# Patient Record
Sex: Female | Born: 1980 | Race: White | Hispanic: No | Marital: Married | Smoking: Never smoker
Health system: Southern US, Community
[De-identification: ages and names within clinical notes are randomized; demographics above are authoritative.]

## PROBLEM LIST (undated history)

## (undated) DIAGNOSIS — J189 Pneumonia, unspecified organism: Secondary | ICD-10-CM

## (undated) DIAGNOSIS — N200 Calculus of kidney: Secondary | ICD-10-CM

## (undated) DIAGNOSIS — Z86718 Personal history of other venous thrombosis and embolism: Secondary | ICD-10-CM

## (undated) DIAGNOSIS — F411 Generalized anxiety disorder: Secondary | ICD-10-CM

## (undated) DIAGNOSIS — Z654 Victim of crime and terrorism: Secondary | ICD-10-CM

## (undated) DIAGNOSIS — Z87442 Personal history of urinary calculi: Secondary | ICD-10-CM

## (undated) DIAGNOSIS — Z9889 Other specified postprocedural states: Secondary | ICD-10-CM

## (undated) DIAGNOSIS — R112 Nausea with vomiting, unspecified: Secondary | ICD-10-CM

## (undated) HISTORY — PX: DILATION AND CURETTAGE OF UTERUS: SHX78

## (undated) HISTORY — PX: CHOLECYSTECTOMY: SHX55

## (undated) HISTORY — PX: LITHOTRIPSY: SUR834

## (undated) HISTORY — DX: Calculus of kidney: N20.0

## (undated) HISTORY — DX: Victim of crime and terrorism: Z65.4

## (undated) HISTORY — DX: Personal history of other venous thrombosis and embolism: Z86.718

## (undated) HISTORY — DX: Generalized anxiety disorder: F41.1

---

## 2018-09-16 DIAGNOSIS — N946 Dysmenorrhea, unspecified: Secondary | ICD-10-CM | POA: Diagnosis not present

## 2018-09-16 DIAGNOSIS — Z124 Encounter for screening for malignant neoplasm of cervix: Secondary | ICD-10-CM | POA: Diagnosis not present

## 2018-11-09 ENCOUNTER — Ambulatory Visit (INDEPENDENT_AMBULATORY_CARE_PROVIDER_SITE_OTHER): Payer: BLUE CROSS/BLUE SHIELD | Admitting: Obstetrics & Gynecology

## 2018-11-09 ENCOUNTER — Encounter: Payer: Self-pay | Admitting: Obstetrics & Gynecology

## 2018-11-09 ENCOUNTER — Other Ambulatory Visit: Payer: Self-pay

## 2018-11-09 DIAGNOSIS — E559 Vitamin D deficiency, unspecified: Secondary | ICD-10-CM | POA: Insufficient documentation

## 2018-11-09 DIAGNOSIS — N920 Excessive and frequent menstruation with regular cycle: Secondary | ICD-10-CM | POA: Insufficient documentation

## 2018-11-09 DIAGNOSIS — E538 Deficiency of other specified B group vitamins: Secondary | ICD-10-CM | POA: Diagnosis not present

## 2018-11-09 NOTE — Addendum Note (Signed)
Addended by: Kathie Dike on: 11/09/2018 09:58 AM   Modules accepted: Orders

## 2018-11-09 NOTE — Progress Notes (Signed)
I connected with  Vickie Morgan on 11/09/18 by a video enabled telemedicine application and verified that I am speaking with the correct person using two identifiers.   I discussed the limitations of evaluation and management by telemedicine. The patient expressed understanding and agreed to proceed.

## 2018-11-09 NOTE — Progress Notes (Signed)
   TELEHEALTH VIRTUAL GYNECOLOGY VISIT ENCOUNTER NOTE  I connected with Vickie Morgan on 11/09/18 at  8:30 AM EDT by WebEx at home and verified that I am speaking with the correct person using two identifiers.   I discussed the limitations, risks, security and privacy concerns of performing an evaluation and management service by telephone and the availability of in person appointments. I also discussed with the patient that there may be a patient responsible charge related to this service. The patient expressed understanding and agreed to proceed. She had an u/s which showed a 14 mm endometrium and she was given provera but she had hives after taking 2 pills.    History:  Vickie Morgan is a 38 y.o. married G1P0010 (2 cats and 1 dog)  female being evaluated today for heavy periods. These last for 3 days, then stop for a day and then bleed heavily for another 3 days. This has been going on for about 3 years. No blood work has been done. She denies any abnormal vaginal discharge, bleeding, pelvic pain or other concerns.  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    She had a d&c in her 56s with an abnormal uterine pregnancy.   Past Medical History:  Diagnosis Date  . Kidney stones    The following portions of the patient's history were reviewed and updated as appropriate: allergies, current medications, past family history, past medical history, past social history, past surgical history and problem list.   Health Maintenance:  Inadequate pap   Review of Systems:  Pertinent items noted in HPI and remainder of comprehensive ROS otherwise negative. Married for 3 years, uses natural family planning, wants a future pregnancy. She is homemaker. She has lost 30# with diet.  Physical Exam:   General:  Alert, oriented and cooperative.   Mental Status: Normal mood and affect perceived. Normal judgment and thought content.  Physical exam deferred due to nature of the encounter   Labs and Imaging No results found for this or any previous visit (from the past 336 hour(s)). No results found.    Assessment and Plan:     Menorrhagia- check CBC, TSH I offered EMBX versus hysteroscopy and d&c She wants the d&c, hysteroscopy. I emailed [place] to schedule this, told patient that surgery is backlogged.   Check Vits B12 and D since she has a deficiency.   I discussed the assessment and treatment plan with the patient. The patient was provided an opportunity to ask questions and all were answered. The patient agreed with the plan and demonstrated an understanding of the instructions.   The patient was advised to call back or seek an in-person evaluation/go to the ED if the symptoms worsen or if the condition fails to improve as anticipated.  I provided 10 minutes of non-face-to-face time during this encounter.   Allie Bossier, MD [facility] [employer], [facility]

## 2018-11-10 ENCOUNTER — Other Ambulatory Visit: Payer: Self-pay | Admitting: Obstetrics & Gynecology

## 2018-11-10 LAB — CBC
HCT: 39.4 % (ref 35.0–45.0)
Hemoglobin: 13.3 g/dL (ref 11.7–15.5)
MCH: 28.4 pg (ref 27.0–33.0)
MCHC: 33.8 g/dL (ref 32.0–36.0)
MCV: 84.2 fL (ref 80.0–100.0)
MPV: 12.7 fL — ABNORMAL HIGH (ref 7.5–12.5)
Platelets: 212 10*3/uL (ref 140–400)
RBC: 4.68 10*6/uL (ref 3.80–5.10)
RDW: 13.6 % (ref 11.0–15.0)
WBC: 7.7 10*3/uL (ref 3.8–10.8)

## 2018-11-10 LAB — TSH: TSH: 2.09 mIU/L

## 2018-11-10 LAB — VITAMIN D 25 HYDROXY (VIT D DEFICIENCY, FRACTURES): Vit D, 25-Hydroxy: 15 ng/mL — ABNORMAL LOW (ref 30–100)

## 2018-11-10 LAB — VITAMIN B12: Vitamin B-12: 264 pg/mL (ref 200–1100)

## 2018-11-10 MED ORDER — VITAMIN D (ERGOCALCIFEROL) 1.25 MG (50000 UNIT) PO CAPS: 50000.0000 [IU] | ORAL_CAPSULE | ORAL | 0 refills | Status: DC

## 2018-11-10 NOTE — Progress Notes (Unsigned)
Vitamin D prescribed for deficiency. 

## 2019-01-04 ENCOUNTER — Ambulatory Visit (INDEPENDENT_AMBULATORY_CARE_PROVIDER_SITE_OTHER): Payer: BC Managed Care – PPO | Admitting: Obstetrics & Gynecology

## 2019-01-04 ENCOUNTER — Other Ambulatory Visit: Payer: Self-pay

## 2019-01-04 ENCOUNTER — Encounter: Payer: Self-pay | Admitting: Obstetrics & Gynecology

## 2019-01-04 VITALS — BP 150/88 | HR 98 | Resp 16 | Ht 67.5 in | Wt 243.0 lb

## 2019-01-04 DIAGNOSIS — Z01419 Encounter for gynecological examination (general) (routine) without abnormal findings: Secondary | ICD-10-CM | POA: Diagnosis not present

## 2019-01-04 DIAGNOSIS — Z3202 Encounter for pregnancy test, result negative: Secondary | ICD-10-CM | POA: Diagnosis not present

## 2019-01-04 DIAGNOSIS — Z0000 Encounter for general adult medical examination without abnormal findings: Secondary | ICD-10-CM

## 2019-01-04 DIAGNOSIS — Z113 Encounter for screening for infections with a predominantly sexual mode of transmission: Secondary | ICD-10-CM | POA: Diagnosis not present

## 2019-01-04 DIAGNOSIS — E6609 Other obesity due to excess calories: Secondary | ICD-10-CM

## 2019-01-04 DIAGNOSIS — Z124 Encounter for screening for malignant neoplasm of cervix: Secondary | ICD-10-CM | POA: Diagnosis not present

## 2019-01-04 DIAGNOSIS — N938 Other specified abnormal uterine and vaginal bleeding: Secondary | ICD-10-CM | POA: Insufficient documentation

## 2019-01-04 DIAGNOSIS — E669 Obesity, unspecified: Secondary | ICD-10-CM | POA: Insufficient documentation

## 2019-01-04 DIAGNOSIS — Z1151 Encounter for screening for human papillomavirus (HPV): Secondary | ICD-10-CM

## 2019-01-04 DIAGNOSIS — E559 Vitamin D deficiency, unspecified: Secondary | ICD-10-CM

## 2019-01-04 LAB — POCT URINE PREGNANCY: Preg Test, Ur: NEGATIVE
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
## 2019-01-05 LAB — HIV ANTIBODY (ROUTINE TESTING W REFLEX): HIV 1&2 Ab, 4th Generation: NONREACTIVE

## 2019-01-05 LAB — HEPATITIS B SURFACE ANTIGEN: Hepatitis B Surface Ag: NONREACTIVE

## 2019-01-05 LAB — CYTOLOGY - PAP
Chlamydia: NEGATIVE
Diagnosis: NEGATIVE
HPV: NOT DETECTED
Neisseria Gonorrhea: NEGATIVE

## 2019-01-05 LAB — HEPATITIS C ANTIBODY
Hepatitis C Ab: NONREACTIVE
SIGNAL TO CUT-OFF: 0.01 (ref <1.00)

## 2019-01-05 LAB — RPR: RPR Ser Ql: NONREACTIVE

## 2019-01-05 LAB — HSV 2 ANTIBODY, IGG: HSV 2 Glycoprotein G Ab, IgG: <0.9 index

## 2019-01-11 ENCOUNTER — Emergency Department (HOSPITAL_COMMUNITY): Payer: BC Managed Care – PPO

## 2019-01-11 ENCOUNTER — Encounter (HOSPITAL_COMMUNITY): Payer: Self-pay | Admitting: Emergency Medicine

## 2019-01-11 ENCOUNTER — Other Ambulatory Visit: Payer: Self-pay

## 2019-01-11 ENCOUNTER — Emergency Department (INDEPENDENT_AMBULATORY_CARE_PROVIDER_SITE_OTHER)
Admission: EM | Admit: 2019-01-11 | Discharge: 2019-01-11 | Disposition: A | Discharge status: Home / Self Care | Payer: BC Managed Care – PPO | Source: Home / Self Care

## 2019-01-11 ENCOUNTER — Emergency Department (HOSPITAL_COMMUNITY)
Admission: EM | Admit: 2019-01-11 | Discharge: 2019-01-11 | Disposition: A | Discharge status: Home / Self Care | Payer: BC Managed Care – PPO | Attending: Emergency Medicine | Admitting: Emergency Medicine

## 2019-01-11 DIAGNOSIS — R1031 Right lower quadrant pain: Secondary | ICD-10-CM

## 2019-01-11 DIAGNOSIS — Z9049 Acquired absence of other specified parts of digestive tract: Secondary | ICD-10-CM | POA: Insufficient documentation

## 2019-01-11 DIAGNOSIS — Z79899 Other long term (current) drug therapy: Secondary | ICD-10-CM | POA: Insufficient documentation

## 2019-01-11 DIAGNOSIS — R1011 Right upper quadrant pain: Secondary | ICD-10-CM | POA: Diagnosis not present

## 2019-01-11 DIAGNOSIS — R1032 Left lower quadrant pain: Secondary | ICD-10-CM | POA: Diagnosis not present

## 2019-01-11 DIAGNOSIS — R109 Unspecified abdominal pain: Secondary | ICD-10-CM

## 2019-01-11 LAB — CBC WITH DIFFERENTIAL/PLATELET
Abs Immature Granulocytes: 0.03 10*3/uL (ref 0.00–0.07)
Basophils Absolute: 0.1 10*3/uL (ref 0.0–0.1)
Basophils Relative: 1 %
Eosinophils Absolute: 0.3 10*3/uL (ref 0.0–0.5)
Eosinophils Relative: 3 %
HCT: 41 % (ref 36.0–46.0)
Hemoglobin: 13 g/dL (ref 12.0–15.0)
Immature Granulocytes: 0 %
Lymphocytes Relative: 20 %
Lymphs Abs: 2 10*3/uL (ref 0.7–4.0)
MCH: 28 pg (ref 26.0–34.0)
MCHC: 31.7 g/dL (ref 30.0–36.0)
MCV: 88.2 fL (ref 80.0–100.0)
Monocytes Absolute: 0.7 10*3/uL (ref 0.1–1.0)
Monocytes Relative: 7 %
Neutro Abs: 6.8 10*3/uL (ref 1.7–7.7)
Neutrophils Relative %: 69 %
Platelets: 230 10*3/uL (ref 150–400)
RBC: 4.65 MIL/uL (ref 3.87–5.11)
RDW: 13.8 % (ref 11.5–15.5)
WBC: 9.8 10*3/uL (ref 4.0–10.5)
nRBC: 0 % (ref 0.0–0.2)

## 2019-01-11 LAB — POCT URINALYSIS DIP (MANUAL ENTRY)
Bilirubin, UA: NEGATIVE
Blood, UA: NEGATIVE
Glucose, UA: NEGATIVE mg/dL
Ketones, POC UA: NEGATIVE mg/dL
Leukocytes, UA: NEGATIVE
Nitrite, UA: NEGATIVE
Protein Ur, POC: NEGATIVE mg/dL
Spec Grav, UA: 1.015 (ref 1.010–1.025)
Urobilinogen, UA: 0.2 E.U./dL
pH, UA: 8.5 — AB (ref 5.0–8.0)

## 2019-01-11 LAB — COMPREHENSIVE METABOLIC PANEL
ALT: 19 U/L (ref 0–44)
AST: 14 U/L — ABNORMAL LOW (ref 15–41)
Albumin: 4 g/dL (ref 3.5–5.0)
Alkaline Phosphatase: 57 U/L (ref 38–126)
Anion gap: 9 (ref 5–15)
BUN: 11 mg/dL (ref 6–20)
CO2: 23 mmol/L (ref 22–32)
Calcium: 8.6 mg/dL — ABNORMAL LOW (ref 8.9–10.3)
Chloride: 107 mmol/L (ref 98–111)
Creatinine, Ser: 0.69 mg/dL (ref 0.44–1.00)
GFR calc Af Amer: >60 mL/min (ref >60)
GFR calc non Af Amer: >60 mL/min (ref >60)
Glucose, Bld: 115 mg/dL — ABNORMAL HIGH (ref 70–99)
Potassium: 3.3 mmol/L — ABNORMAL LOW (ref 3.5–5.1)
Sodium: 139 mmol/L (ref 135–145)
Total Bilirubin: 0.3 mg/dL (ref 0.3–1.2)
Total Protein: 6.8 g/dL (ref 6.5–8.1)

## 2019-01-11 LAB — I-STAT BETA HCG BLOOD, ED (MC, WL, AP ONLY): I-stat hCG, quantitative: <5 m[IU]/mL (ref <5)

## 2019-01-11 LAB — LIPASE, BLOOD: Lipase: 28 U/L (ref 11–51)

## 2019-01-11 MED ORDER — SODIUM CHLORIDE 0.9 % IV BOLUS
1000.0000 mL | Freq: Once | INTRAVENOUS | Status: AC
  Administered 2019-01-11: 1000 mL via INTRAVENOUS

## 2019-01-11 MED ORDER — ONDANSETRON HCL 4 MG PO TABS: 4.0000 mg | ORAL_TABLET | Freq: Three times a day (TID) | ORAL | 0 refills | Status: DC | PRN

## 2019-01-11 MED ORDER — SODIUM CHLORIDE (PF) 0.9 % IJ SOLN
INTRAMUSCULAR | Status: AC
  Filled 2019-01-11: qty 50

## 2019-01-11 MED ORDER — IOHEXOL 300 MG/ML  SOLN
100.0000 mL | Freq: Once | INTRAMUSCULAR | Status: AC | PRN
  Administered 2019-01-11: 100 mL via INTRAVENOUS

## 2019-01-11 MED ORDER — ONDANSETRON HCL 4 MG/2ML IJ SOLN
4.0000 mg | Freq: Once | INTRAMUSCULAR | Status: AC
  Administered 2019-01-11: 20:00:00 4 mg via INTRAVENOUS
  Filled 2019-01-11: qty 2

## 2019-01-11 MED ORDER — FENTANYL CITRATE (PF) 100 MCG/2ML IJ SOLN
50.0000 ug | Freq: Once | INTRAMUSCULAR | Status: AC
  Administered 2019-01-11: 50 ug via INTRAVENOUS
  Filled 2019-01-11: qty 2
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
## 2019-01-11 NOTE — ED Notes (Signed)
Pt reports that she has rt sided intermittent abdominal pain 6/10 that radiates to entire stomach with associated nausea. Pt states that pain started 3 days ago, and has some diarrhea

## 2019-01-11 NOTE — Discharge Instructions (Signed)
°  Your exam is concerning for appendicitis, a medical emergency. You have declined EMS transport, but it is still highly advised that you have your husband drive you to the emergency department for further evaluation and treatment of your abdominal pain.  Please do not eat or drink anything along the way in case surgery is indicated later this evening.

## 2019-01-11 NOTE — ED Triage Notes (Signed)
Right lower abdominal pain x 3 days.  Now has become generalized, and radiates to her back.  Has had some nausea, and a hx of kidney stones.

## 2019-01-11 NOTE — ED Triage Notes (Signed)
Having intermittent lower abd pains on right side for 3 days. Reports that sent by UC for CT scan of abd to rule out appendicitis. Diarrhea is normal due to IBS. Pains are different than kidney stones.
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
## 2019-01-18 ENCOUNTER — Ambulatory Visit (INDEPENDENT_AMBULATORY_CARE_PROVIDER_SITE_OTHER): Payer: BC Managed Care – PPO | Admitting: Osteopathic Medicine

## 2019-01-18 ENCOUNTER — Encounter: Payer: Self-pay | Admitting: Osteopathic Medicine

## 2019-01-18 VITALS — BP 136/80 | HR 90 | Temp 98.4°F | Ht 67.5 in | Wt 242.4 lb

## 2019-01-18 DIAGNOSIS — R5382 Chronic fatigue, unspecified: Secondary | ICD-10-CM | POA: Diagnosis not present

## 2019-01-18 DIAGNOSIS — G43709 Chronic migraine without aura, not intractable, without status migrainosus: Secondary | ICD-10-CM

## 2019-01-18 DIAGNOSIS — K580 Irritable bowel syndrome with diarrhea: Secondary | ICD-10-CM | POA: Diagnosis not present

## 2019-01-18 DIAGNOSIS — Z87442 Personal history of urinary calculi: Secondary | ICD-10-CM | POA: Insufficient documentation

## 2019-01-18 DIAGNOSIS — Z654 Victim of crime and terrorism: Secondary | ICD-10-CM | POA: Insufficient documentation

## 2019-01-18 DIAGNOSIS — M791 Myalgia, unspecified site: Secondary | ICD-10-CM

## 2019-01-18 DIAGNOSIS — R6889 Other general symptoms and signs: Secondary | ICD-10-CM

## 2019-01-18 DIAGNOSIS — Z9049 Acquired absence of other specified parts of digestive tract: Secondary | ICD-10-CM | POA: Insufficient documentation

## 2019-01-18 DIAGNOSIS — Z8619 Personal history of other infectious and parasitic diseases: Secondary | ICD-10-CM

## 2019-01-18 DIAGNOSIS — Z8659 Personal history of other mental and behavioral disorders: Secondary | ICD-10-CM | POA: Insufficient documentation

## 2019-01-18 DIAGNOSIS — R635 Abnormal weight gain: Secondary | ICD-10-CM

## 2019-01-18 MED ORDER — LORAZEPAM 0.5 MG PO TABS: 0.5000 mg | ORAL_TABLET | Freq: Three times a day (TID) | ORAL | 0 refills | Status: DC | PRN

## 2019-01-18 NOTE — Patient Instructions (Signed)
Migraine Prevention: 4-6 weeks for any effect, 2-3 months for full effect   Butterbur   Feverfew  Magnesium supplementation 400-800 mg/day Mag Citrate (not Mag Oxide)
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
## 2019-01-19 ENCOUNTER — Encounter (HOSPITAL_BASED_OUTPATIENT_CLINIC_OR_DEPARTMENT_OTHER): Payer: Self-pay | Admitting: *Deleted

## 2019-01-19 ENCOUNTER — Other Ambulatory Visit: Payer: Self-pay

## 2019-01-19 LAB — RHEUMATOID FACTOR: Rheumatoid fact SerPl-aCnc: <14 IU/mL (ref <14)

## 2019-01-19 LAB — COMPLETE METABOLIC PANEL WITH GFR
AG Ratio: 1.8 (calc) (ref 1.0–2.5)
ALT: 17 U/L (ref 6–29)
AST: 13 U/L (ref 10–30)
Albumin: 4.3 g/dL (ref 3.6–5.1)
Alkaline phosphatase (APISO): 59 U/L (ref 31–125)
BUN: 8 mg/dL (ref 7–25)
CO2: 26 mmol/L (ref 20–32)
Calcium: 9.3 mg/dL (ref 8.6–10.2)
Chloride: 106 mmol/L (ref 98–110)
Creat: 0.66 mg/dL (ref 0.50–1.10)
GFR, Est African American: 130 mL/min/{1.73_m2} (ref >=60)
GFR, Est Non African American: 112 mL/min/{1.73_m2} (ref >=60)
Globulin: 2.4 g/dL (calc) (ref 1.9–3.7)
Glucose, Bld: 111 mg/dL (ref 65–139)
Potassium: 3.9 mmol/L (ref 3.5–5.3)
Sodium: 142 mmol/L (ref 135–146)
Total Bilirubin: 0.4 mg/dL (ref 0.2–1.2)
Total Protein: 6.7 g/dL (ref 6.1–8.1)

## 2019-01-19 LAB — SEDIMENTATION RATE: Sed Rate: 4 mm/h (ref 0–20)

## 2019-01-19 LAB — ANA: Anti Nuclear Antibody (ANA): NEGATIVE

## 2019-01-19 LAB — CK: Total CK: 44 U/L (ref 29–143)

## 2019-01-19 LAB — LIPID PANEL
Cholesterol: 231 mg/dL — ABNORMAL HIGH (ref <200)
HDL: 39 mg/dL — ABNORMAL LOW (ref >=50)
LDL Cholesterol (Calc): 153 mg/dL (calc) — ABNORMAL HIGH
Non-HDL Cholesterol (Calc): 192 mg/dL (calc) — ABNORMAL HIGH (ref <130)
Total CHOL/HDL Ratio: 5.9 (calc) — ABNORMAL HIGH (ref <5.0)
Triglycerides: 233 mg/dL — ABNORMAL HIGH (ref <150)

## 2019-01-19 LAB — HIGH SENSITIVITY CRP: hs-CRP: 4.3 mg/L — ABNORMAL HIGH

## 2019-01-20 ENCOUNTER — Telehealth: Payer: Self-pay

## 2019-01-20 MED ORDER — LORAZEPAM 0.5 MG PO TABS: 0.5000 mg | ORAL_TABLET | Freq: Three times a day (TID) | ORAL | 0 refills | Status: DC | PRN

## 2019-01-20 NOTE — Telephone Encounter (Signed)
Sent!

## 2019-01-20 NOTE — Telephone Encounter (Signed)
Pt called stating that lorazepam rx went to the wrong pharmacy. She is requesting for rx to be sent to [facility] located in [place]. I have called [facility] in [place] and cancelled previous rx. Spoke to [employer]. Pls resend rx to [facility] in [place]. Thanks.

## 2019-01-20 NOTE — Telephone Encounter (Signed)
Pt advised.

## 2019-01-25 ENCOUNTER — Encounter (HOSPITAL_BASED_OUTPATIENT_CLINIC_OR_DEPARTMENT_OTHER)
Admission: RE | Admit: 2019-01-25 | Discharge: 2019-01-25 | Disposition: A | Discharge status: Home / Self Care | Payer: BC Managed Care – PPO | Source: Ambulatory Visit | Attending: Obstetrics & Gynecology | Admitting: Obstetrics & Gynecology

## 2019-01-25 ENCOUNTER — Other Ambulatory Visit (HOSPITAL_COMMUNITY)
Admission: RE | Admit: 2019-01-25 | Discharge: 2019-01-25 | Disposition: A | Discharge status: Home / Self Care | Payer: BC Managed Care – PPO | Source: Ambulatory Visit | Attending: Obstetrics & Gynecology | Admitting: Obstetrics & Gynecology

## 2019-01-25 ENCOUNTER — Other Ambulatory Visit: Payer: Self-pay

## 2019-01-25 DIAGNOSIS — N920 Excessive and frequent menstruation with regular cycle: Secondary | ICD-10-CM | POA: Diagnosis not present

## 2019-01-25 DIAGNOSIS — Z01812 Encounter for preprocedural laboratory examination: Secondary | ICD-10-CM | POA: Diagnosis not present

## 2019-01-25 DIAGNOSIS — Z1159 Encounter for screening for other viral diseases: Secondary | ICD-10-CM | POA: Insufficient documentation

## 2019-01-25 LAB — POCT PREGNANCY, URINE: Preg Test, Ur: NEGATIVE

## 2019-01-25 LAB — SARS CORONAVIRUS 2 (TAT 6-24 HRS): SARS Coronavirus 2: NEGATIVE

## 2019-01-25 NOTE — Progress Notes (Signed)
Ensure pre surgery drink given with instructions to complete by 0430 dos, pt verbalized understanding. 

## 2019-01-27 ENCOUNTER — Encounter (HOSPITAL_BASED_OUTPATIENT_CLINIC_OR_DEPARTMENT_OTHER)
Admission: RE | Disposition: A | Discharge status: Home / Self Care | Payer: Self-pay | Source: Home / Self Care | Attending: Obstetrics & Gynecology

## 2019-01-27 ENCOUNTER — Ambulatory Visit (HOSPITAL_BASED_OUTPATIENT_CLINIC_OR_DEPARTMENT_OTHER)
Admission: RE | Admit: 2019-01-27 | Discharge: 2019-01-27 | Disposition: A | Discharge status: Home / Self Care | Payer: BC Managed Care – PPO | Attending: Obstetrics & Gynecology | Admitting: Obstetrics & Gynecology

## 2019-01-27 ENCOUNTER — Ambulatory Visit (HOSPITAL_BASED_OUTPATIENT_CLINIC_OR_DEPARTMENT_OTHER): Payer: BC Managed Care – PPO | Admitting: Certified Registered Nurse Anesthetist

## 2019-01-27 ENCOUNTER — Encounter (HOSPITAL_BASED_OUTPATIENT_CLINIC_OR_DEPARTMENT_OTHER): Payer: Self-pay | Admitting: *Deleted

## 2019-01-27 DIAGNOSIS — Z881 Allergy status to other antibiotic agents status: Secondary | ICD-10-CM | POA: Diagnosis not present

## 2019-01-27 DIAGNOSIS — Z888 Allergy status to other drugs, medicaments and biological substances status: Secondary | ICD-10-CM | POA: Insufficient documentation

## 2019-01-27 DIAGNOSIS — Z91018 Allergy to other foods: Secondary | ICD-10-CM | POA: Diagnosis not present

## 2019-01-27 DIAGNOSIS — F411 Generalized anxiety disorder: Secondary | ICD-10-CM | POA: Insufficient documentation

## 2019-01-27 DIAGNOSIS — Z87892 Personal history of anaphylaxis: Secondary | ICD-10-CM | POA: Diagnosis not present

## 2019-01-27 DIAGNOSIS — Z79899 Other long term (current) drug therapy: Secondary | ICD-10-CM | POA: Diagnosis not present

## 2019-01-27 DIAGNOSIS — F4310 Post-traumatic stress disorder, unspecified: Secondary | ICD-10-CM | POA: Diagnosis not present

## 2019-01-27 DIAGNOSIS — Z885 Allergy status to narcotic agent status: Secondary | ICD-10-CM | POA: Diagnosis not present

## 2019-01-27 DIAGNOSIS — N920 Excessive and frequent menstruation with regular cycle: Secondary | ICD-10-CM

## 2019-01-27 DIAGNOSIS — N938 Other specified abnormal uterine and vaginal bleeding: Secondary | ICD-10-CM | POA: Diagnosis not present

## 2019-01-27 HISTORY — DX: Nausea with vomiting, unspecified: Z98.890

## 2019-01-27 HISTORY — DX: Other specified postprocedural states: R11.2

## 2019-01-27 HISTORY — DX: Pneumonia, unspecified organism: J18.9

## 2019-01-27 HISTORY — PX: HYSTEROSCOPY WITH D & C: SHX1775

## 2019-01-27 HISTORY — DX: Personal history of urinary calculi: Z87.442

## 2019-01-27 SURGERY — DILATATION AND CURETTAGE /HYSTEROSCOPY
Anesthesia: General | Site: Vagina

## 2019-01-27 MED ORDER — SCOPOLAMINE 1 MG/3DAYS TD PT72
1.0000 | MEDICATED_PATCH | Freq: Once | TRANSDERMAL | Status: DC
  Administered 2019-01-27: 1.5 mg via TRANSDERMAL

## 2019-01-27 MED ORDER — LIDOCAINE 2% (20 MG/ML) 5 ML SYRINGE
INTRAMUSCULAR | Status: AC
  Filled 2019-01-27: qty 5

## 2019-01-27 MED ORDER — FENTANYL CITRATE (PF) 100 MCG/2ML IJ SOLN
50.0000 ug | INTRAMUSCULAR | Status: DC | PRN
  Administered 2019-01-27 (×2): 50 ug via INTRAVENOUS

## 2019-01-27 MED ORDER — HYDROMORPHONE HCL 1 MG/ML IJ SOLN: 0.2500 mg | INTRAMUSCULAR | Status: DC | PRN

## 2019-01-27 MED ORDER — LIDOCAINE HCL (CARDIAC) PF 100 MG/5ML IV SOSY
PREFILLED_SYRINGE | INTRAVENOUS | Status: DC | PRN
  Administered 2019-01-27: 60 mg via INTRAVENOUS

## 2019-01-27 MED ORDER — PROPOFOL 10 MG/ML IV BOLUS
INTRAVENOUS | Status: AC
  Filled 2019-01-27: qty 20

## 2019-01-27 MED ORDER — SILVER NITRATE-POT NITRATE 75-25 % EX MISC
CUTANEOUS | Status: AC
  Filled 2019-01-27: qty 1

## 2019-01-27 MED ORDER — IBUPROFEN 600 MG PO TABS: 600.0000 mg | ORAL_TABLET | Freq: Four times a day (QID) | ORAL | 1 refills | Status: DC | PRN

## 2019-01-27 MED ORDER — KETOROLAC TROMETHAMINE 30 MG/ML IJ SOLN
INTRAMUSCULAR | Status: AC
  Filled 2019-01-27: qty 1

## 2019-01-27 MED ORDER — KETOROLAC TROMETHAMINE 30 MG/ML IJ SOLN
INTRAMUSCULAR | Status: DC | PRN
  Administered 2019-01-27: 30 mg via INTRAVENOUS

## 2019-01-27 MED ORDER — LACTATED RINGERS IV SOLN
INTRAVENOUS | Status: DC
  Administered 2019-01-27: 09:00:00 via INTRAVENOUS

## 2019-01-27 MED ORDER — SODIUM CHLORIDE 0.9 % IR SOLN
Status: DC | PRN
  Administered 2019-01-27: 3000 mL

## 2019-01-27 MED ORDER — DEXAMETHASONE SODIUM PHOSPHATE 4 MG/ML IJ SOLN
INTRAMUSCULAR | Status: DC | PRN
  Administered 2019-01-27: 5 mg via INTRAVENOUS

## 2019-01-27 MED ORDER — BUPIVACAINE HCL (PF) 0.5 % IJ SOLN
INTRAMUSCULAR | Status: AC
  Filled 2019-01-27: qty 30

## 2019-01-27 MED ORDER — ONDANSETRON HCL 4 MG/2ML IJ SOLN
INTRAMUSCULAR | Status: AC
  Filled 2019-01-27: qty 2

## 2019-01-27 MED ORDER — MIDAZOLAM HCL 2 MG/2ML IJ SOLN
INTRAMUSCULAR | Status: AC
  Filled 2019-01-27: qty 2

## 2019-01-27 MED ORDER — DEXMEDETOMIDINE HCL 200 MCG/2ML IV SOLN
INTRAVENOUS | Status: DC | PRN
  Administered 2019-01-27: 12 ug via INTRAVENOUS

## 2019-01-27 MED ORDER — SCOPOLAMINE 1 MG/3DAYS TD PT72
MEDICATED_PATCH | TRANSDERMAL | Status: AC
  Filled 2019-01-27: qty 1

## 2019-01-27 MED ORDER — MIDAZOLAM HCL 2 MG/2ML IJ SOLN
1.0000 mg | INTRAMUSCULAR | Status: DC | PRN
  Administered 2019-01-27: 10:00:00 2 mg via INTRAVENOUS

## 2019-01-27 MED ORDER — NAPROXEN 500 MG PO TABS: 500.0000 mg | ORAL_TABLET | Freq: Two times a day (BID) | ORAL | 1 refills | Status: DC

## 2019-01-27 MED ORDER — ONDANSETRON HCL 4 MG/2ML IJ SOLN
INTRAMUSCULAR | Status: DC | PRN
  Administered 2019-01-27: 4 mg via INTRAVENOUS

## 2019-01-27 MED ORDER — PROPOFOL 10 MG/ML IV BOLUS
INTRAVENOUS | Status: DC | PRN
  Administered 2019-01-27: 200 mg via INTRAVENOUS

## 2019-01-27 MED ORDER — FENTANYL CITRATE (PF) 100 MCG/2ML IJ SOLN
INTRAMUSCULAR | Status: AC
  Filled 2019-01-27: qty 2

## 2019-01-27 MED ORDER — LACTATED RINGERS IV SOLN: INTRAVENOUS | Status: DC

## 2019-01-27 MED ORDER — BUPIVACAINE HCL (PF) 0.5 % IJ SOLN
INTRAMUSCULAR | Status: DC | PRN
  Administered 2019-01-27: 30 mL

## 2019-01-27 SURGICAL SUPPLY — 24 items
BIPOLAR CUTTING LOOP 21FR (ELECTRODE)
BRIEF STRETCH FOR OB PAD XXL (UNDERPADS AND DIAPERS) ×3 IMPLANT
CANISTER SUCT 3000ML PPV (MISCELLANEOUS) ×1 IMPLANT
DILATOR CANAL MILEX (MISCELLANEOUS) IMPLANT
ELECT REM PT RETURN 9FT ADLT (ELECTROSURGICAL)
ELECTRODE REM PT RTRN 9FT ADLT (ELECTROSURGICAL) IMPLANT
GAUZE 4X4 16PLY RFD (DISPOSABLE) ×2 IMPLANT
GLOVE BIO SURGEON STRL SZ 6.5 (GLOVE) ×2 IMPLANT
GLOVE BIO SURGEONS STRL SZ 6.5 (GLOVE) ×1
GLOVE BIOGEL PI IND STRL 7.0 (GLOVE) ×1 IMPLANT
GLOVE BIOGEL PI INDICATOR 7.0 (GLOVE) ×2
GOWN STRL REUS W/ TWL XL LVL3 (GOWN DISPOSABLE) ×1 IMPLANT
GOWN STRL REUS W/TWL LRG LVL3 (GOWN DISPOSABLE) ×3 IMPLANT
GOWN STRL REUS W/TWL XL LVL3 (GOWN DISPOSABLE) ×2
KIT PROCEDURE FLUENT (KITS) ×3 IMPLANT
LOOP CUTTING BIPOLAR 21FR (ELECTRODE) IMPLANT
NDL SPNL 18GX3.5 QUINCKE PK (NEEDLE) IMPLANT
NEEDLE SPNL 18GX3.5 QUINCKE PK (NEEDLE) ×3 IMPLANT
PACK VAGINAL MINOR WOMEN LF (CUSTOM PROCEDURE TRAY) ×3 IMPLANT
PAD OB MATERNITY 4.3X12.25 (PERSONAL CARE ITEMS) ×3 IMPLANT
PAD PREP 24X48 CUFFED NSTRL (MISCELLANEOUS) ×3 IMPLANT
SEAL ROD LENS SCOPE MYOSURE (ABLATOR) ×2 IMPLANT
SLEEVE SCD COMPRESS KNEE MED (MISCELLANEOUS) ×3 IMPLANT
TOWEL GREEN STERILE FF (TOWEL DISPOSABLE) ×6 IMPLANT

## 2019-01-27 NOTE — Op Note (Signed)
01/27/2019  10:36 AM  PATIENT:  Vickie Morgan  38 y.o. female  PRE-OPERATIVE DIAGNOSIS:  Menorrhagia/DUB  POST-OPERATIVE DIAGNOSIS:  same  PROCEDURE:  DILATION AND CURETTAGE, DIAGNOSTIC HYSTEROSCOPY   SURGEON:  Surgeon(s) and Role:    * Virat Prather, Wilhemina Cash, MD - Primary  ANESTHESIA:   local and general  EBL:  5 mL   BLOOD ADMINISTERED:none  DRAINS: none   LOCAL MEDICATIONS USED:  MARCAINE     SPECIMEN:  Source of Specimen:  endometrial curettings  DISPOSITION OF SPECIMEN:  PATHOLOGY  COUNTS:  YES  TOURNIQUET:  * No tourniquets in log *  DICTATION: .Dragon Dictation  PLAN OF CARE: Discharge to home after PACU  PATIENT DISPOSITION:  PACU - hemodynamically stable.   Delay start of Pharmacological VTE agent (>24hrs) due to surgical blood loss or risk of bleeding: not applicable   The risks, benefits, and alternatives of surgery were explained, understood, and accepted. All questions were answered. Consents were signed. In the operating room general anesthesia was applied without complication, and she was placed in the dorsal lithotomy position. Her vagina was prepped and draped in the usual sterile fashion. A bimanual exam revealed a normal size and shape, anteverted, mobile, non-tender, normal adnexal exam.  A speculum was placed and a single-tooth tenaculum was used to grasp the anterior lip of her nulliparous cervix. A total of 30 mL of 0.5% Marcaine was used to perform a paracervical block. Her uterus sounded to 8 cm. Her cervix was carefully and slowly dilated to accommodate a diagnostic hysteroscope. Hysteroscopy revealed a normal endometrium. Both ostia were seen. The fundus was atrophic and only a small bit of somewhat shaggy endometrium was seen lower in the uterus. A curettage was done in all quadrants and the fundus of the uterus. A small amount of tissue was obtained. The tenaculum site had a small amount of bleeding, so I used a silver nitrate to achieve hemostasis.  She  was taken to the recovery room after being extubated. She tolerated the procedure well.

## 2019-01-27 NOTE — Transfer of Care (Signed)
Immediate Anesthesia Transfer of Care Note  Patient: Vickie Morgan  Procedure(s) Performed: DILATATION AND CURETTAGE /HYSTEROSCOPY (N/A Vagina )  Patient Location: PACU  Anesthesia Type:General  Level of Consciousness: awake, alert  and oriented  Airway & Oxygen Therapy: Patient Spontanous Breathing and Patient connected to nasal cannula oxygen  Post-op Assessment: Report given to RN and Post -op Vital signs reviewed and stable  Post vital signs: Reviewed and stable  Last Vitals:  Vitals Value Taken Time  BP 108/74 01/27/19 1028  Temp    Pulse 92 01/27/19 1029  Resp 19 01/27/19 1029  SpO2 100 % 01/27/19 1029  Vitals shown include unvalidated device data.  Last Pain:  Vitals:   01/27/19 0815  TempSrc: Oral  PainSc: 3          Complications: No apparent anesthesia complications

## 2019-01-27 NOTE — Discharge Instructions (Signed)
Dilation and Curettage or Vacuum Curettage, Care After This sheet gives you information about how to care for yourself after your procedure. Your health care provider may also give you more specific instructions. If you have problems or questions, contact your health care provider. What can I expect after the procedure? After your procedure, it is common to have:  Mild pain or cramping.  Some vaginal bleeding or spotting. These may last for up to 2 weeks after your procedure. Follow these instructions at home: Activity   Do not drive or use heavy machinery while taking prescription pain medicine.  Avoid driving for the first 24 hours after your procedure.  Take frequent, short walks, followed by rest periods, throughout the day. Ask your health care provider what activities are safe for you. After 1-2 days, you may be able to return to your normal activities.  Do not lift anything heavier than 10 lb (4.5 kg) until your health care provider approves.  For at least 2 weeks, or as long as told by your health care provider, do not: ? Douche. ? Use tampons. ? Have sexual intercourse. General instructions   Take over-the-counter and prescription medicines only as told by your health care provider. This is especially important if you take blood thinning medicine.  Do not take baths, swim, or use a hot tub until your health care provider approves. Take showers instead of baths.  Wear compression stockings as told by your health care provider. These stockings help to prevent blood clots and reduce swelling in your legs.  It is your responsibility to get the results of your procedure. Ask your health care provider, or the department performing the procedure, when your results will be ready.  Keep all follow-up visits as told by your health care provider. This is important. Contact a health care provider if:  You have severe cramps that get worse or that do not get better with  medicine.  You have severe abdominal pain.  You cannot drink fluids without vomiting.  You develop pain in a different area of your pelvis.  You have bad-smelling vaginal discharge.  You have a rash. Get help right away if:  You have vaginal bleeding that soaks more than one sanitary pad in 1 hour, for 2 hours in a row.  You pass large blood clots from your vagina.  You have a fever that is above 100.49F (38.0C).  Your abdomen feels very tender or hard.  You have chest pain.  You have shortness of breath.  You cough up blood.  You feel dizzy or light-headed.  You faint.  You have pain in your neck or shoulder area. This information is not intended to replace advice given to you by your health care provider. Make sure you discuss any questions you have with your health care provider. Document Released: 07/05/2000 Document Revised: 06/20/2017 Document Reviewed: 02/08/2016 Elsevier Patient Education  2020 Elsevier [employer] No NSAIDS before 4:15pm today. No Zofran before 4pm today.  Post Anesthesia Home Care Instructions  Activity: Get plenty of rest for the remainder of the day. A responsible individual must stay with you for 24 hours following the procedure.  For the next 24 hours, DO NOT: -Drive a car -Advertising copywriterperate machinery -Drink alcoholic beverages -Take any medication unless instructed by your physician -Make any legal decisions or sign important papers.  Meals: Start with liquid foods such as gelatin or soup. Progress to regular foods as tolerated. Avoid greasy, spicy, heavy foods. If nausea and/or vomiting occur,  drink only clear liquids until the nausea and/or vomiting subsides. Call your physician if vomiting continues.  Special Instructions/Symptoms: Your throat may feel dry or sore from the anesthesia or the breathing tube placed in your throat during surgery. If this causes discomfort, gargle with warm salt water. The discomfort should disappear within 24  hours.  If you had a scopolamine patch placed behind your ear for the management of post- operative nausea and/or vomiting:  1. The medication in the patch is effective for 72 hours, after which it should be removed.  Wrap patch in a tissue and discard in the trash. Wash hands thoroughly with soap and water. 2. You may remove the patch earlier than 72 hours if you experience unpleasant side effects which may include dry mouth, dizziness or visual disturbances. 3. Avoid touching the patch. Wash your hands with soap and water after contact with the patch.

## 2019-01-27 NOTE — Anesthesia Procedure Notes (Signed)
Procedure Name: LMA Insertion Date/Time: 01/27/2019 9:55 AM Performed by: Bufford Spikes, CRNA Pre-anesthesia Checklist: Patient identified, Emergency Drugs available, Suction available and Patient being monitored Patient Re-evaluated:Patient Re-evaluated prior to induction Oxygen Delivery Method: Circle system utilized Preoxygenation: Pre-oxygenation with 100% oxygen Induction Type: IV induction Ventilation: Mask ventilation without difficulty LMA: LMA inserted LMA Size: 4.0 Number of attempts: 1 Airway Equipment and Method: Bite block Placement Confirmation: positive ETCO2 Tube secured with: Tape Dental Injury: Teeth and Oropharynx as per pre-operative assessment
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
## 2019-01-27 NOTE — Anesthesia Postprocedure Evaluation (Signed)
Anesthesia Post Note  Patient: Vickie Morgan  Procedure(s) Performed: DILATATION AND CURETTAGE /HYSTEROSCOPY (N/A Vagina )     Patient location during evaluation: PACU Anesthesia Type: General Level of consciousness: awake and alert Pain management: pain level controlled Vital Signs Assessment: post-procedure vital signs reviewed and stable Respiratory status: spontaneous breathing, nonlabored ventilation, respiratory function stable and patient connected to nasal cannula oxygen Cardiovascular status: blood pressure returned to baseline and stable Postop Assessment: no apparent nausea or vomiting Anesthetic complications: no    Last Vitals:  Vitals:   01/27/19 1050 01/27/19 1105  BP:  111/70  Pulse: 77 78  Resp: (!) 9 16  Temp:  36.7 C  SpO2: 100% 100%    Last Pain:  Vitals:   01/27/19 1105  TempSrc:   PainSc: 2                  Montez Hageman

## 2019-01-28 ENCOUNTER — Encounter (HOSPITAL_BASED_OUTPATIENT_CLINIC_OR_DEPARTMENT_OTHER): Payer: Self-pay | Admitting: Obstetrics & Gynecology

## 2019-02-03 ENCOUNTER — Encounter: Payer: Self-pay | Admitting: Osteopathic Medicine

## 2019-02-04 MED ORDER — VIIBRYD 20 MG PO TABS: 20.0000 mg | ORAL_TABLET | Freq: Every day | ORAL | 1 refills | Status: DC

## 2019-02-04 MED ORDER — VIIBRYD STARTER PACK 10 & 20 MG PO KIT: 1.0000 | PACK | Freq: Every day | ORAL | 0 refills | Status: DC

## 2019-02-04 NOTE — Addendum Note (Signed)
Addended by: Maryla Morrow on: 02/04/2019 06:05 PM   Modules accepted: Orders

## 2019-02-08 NOTE — Telephone Encounter (Signed)
Approved today (Viibryd starter pack) CaseId:[id number];Status:Approved;Review Type:Prior Auth;Coverage Start Date:01/09/2019;Coverage End Date:02/08/2020;Pharmacy aware and patient notified.

## 2019-03-01 ENCOUNTER — Encounter: Payer: Self-pay | Admitting: Obstetrics & Gynecology

## 2019-03-01 ENCOUNTER — Other Ambulatory Visit: Payer: Self-pay

## 2019-03-01 ENCOUNTER — Ambulatory Visit (INDEPENDENT_AMBULATORY_CARE_PROVIDER_SITE_OTHER): Payer: BC Managed Care – PPO | Admitting: Obstetrics & Gynecology

## 2019-03-01 VITALS — BP 131/86 | HR 90 | Resp 16 | Ht 67.5 in | Wt 245.0 lb

## 2019-03-01 DIAGNOSIS — Z9889 Other specified postprocedural states: Secondary | ICD-10-CM

## 2019-03-01 NOTE — Progress Notes (Signed)
   Subjective:    Patient ID: Florentina Marquart, female    DOB: 19-Nov-1980, 38 y.o.   MRN: [id number]  HPI 38 yo married P7 here for a post op visit. She had a d&c and h/s last month. She is not having any problems. She had a period 02/22/19, lasted 7 days, heavy and painful. She cannot take OCPs due to a h/o DVT. She uses natural family planning for contraception. She took provera in the past and had "hives". She declines any forms of contraception.  She reports that the chronic pelvic pain has definitely improved.  Review of Systems Pap norma 01/04/19    Objective:   Physical Exam  Breathing, conversing, and ambulating normally Well nourished, well hydrated White female, no apparent distress    Assessment & Plan:  Post op visit- doing well Come back for pap in 2023/prn sooner

## 2019-04-02 ENCOUNTER — Encounter: Payer: Self-pay | Admitting: Osteopathic Medicine

## 2019-04-02 ENCOUNTER — Ambulatory Visit (INDEPENDENT_AMBULATORY_CARE_PROVIDER_SITE_OTHER): Payer: BC Managed Care – PPO | Admitting: Osteopathic Medicine

## 2019-04-02 ENCOUNTER — Ambulatory Visit (HOSPITAL_BASED_OUTPATIENT_CLINIC_OR_DEPARTMENT_OTHER)
Admission: RE | Admit: 2019-04-02 | Discharge: 2019-04-02 | Disposition: A | Discharge status: Home / Self Care | Payer: BC Managed Care – PPO | Source: Ambulatory Visit | Attending: Osteopathic Medicine | Admitting: Osteopathic Medicine

## 2019-04-02 ENCOUNTER — Other Ambulatory Visit: Payer: Self-pay

## 2019-04-02 VITALS — BP 145/91 | HR 98 | Temp 99.0°F | Wt 246.8 lb

## 2019-04-02 DIAGNOSIS — R102 Pelvic and perineal pain: Secondary | ICD-10-CM

## 2019-04-02 DIAGNOSIS — D259 Leiomyoma of uterus, unspecified: Secondary | ICD-10-CM | POA: Diagnosis not present

## 2019-04-02 DIAGNOSIS — R109 Unspecified abdominal pain: Secondary | ICD-10-CM

## 2019-04-02 LAB — POCT URINALYSIS DIPSTICK
Bilirubin, UA: NEGATIVE
Blood, UA: NEGATIVE
Glucose, UA: NEGATIVE
Ketones, UA: NEGATIVE
Leukocytes, UA: NEGATIVE
Nitrite, UA: NEGATIVE
Protein, UA: NEGATIVE
Spec Grav, UA: 1.015 (ref 1.010–1.025)
Urobilinogen, UA: 0.2 E.U./dL
pH, UA: 7.5 (ref 5.0–8.0)

## 2019-04-02 LAB — POCT URINE PREGNANCY: Preg Test, Ur: NEGATIVE

## 2019-04-02 MED ORDER — NAPROXEN 500 MG PO TABS: 500.0000 mg | ORAL_TABLET | Freq: Two times a day (BID) | ORAL | 1 refills | Status: DC

## 2019-04-02 NOTE — Patient Instructions (Addendum)
[address], [place], [place] [address] Ultrasound this evening Will MyChart message you when I have results If results are critical, the on-call provider will be alerted - no news is good news!
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
## 2019-04-05 ENCOUNTER — Ambulatory Visit: Payer: BC Managed Care – PPO | Admitting: Osteopathic Medicine

## 2019-08-02 DIAGNOSIS — H40003 Preglaucoma, unspecified, bilateral: Secondary | ICD-10-CM | POA: Diagnosis not present

## 2019-08-05 ENCOUNTER — Ambulatory Visit (INDEPENDENT_AMBULATORY_CARE_PROVIDER_SITE_OTHER): Payer: BC Managed Care – PPO | Admitting: Physician Assistant

## 2019-08-05 ENCOUNTER — Encounter: Payer: Self-pay | Admitting: Physician Assistant

## 2019-08-05 ENCOUNTER — Telehealth: Payer: Self-pay | Admitting: Neurology

## 2019-08-05 VITALS — Ht 67.5 in | Wt 246.0 lb

## 2019-08-05 DIAGNOSIS — H4710 Unspecified papilledema: Secondary | ICD-10-CM

## 2019-08-05 DIAGNOSIS — R200 Anesthesia of skin: Secondary | ICD-10-CM | POA: Insufficient documentation

## 2019-08-05 DIAGNOSIS — H539 Unspecified visual disturbance: Secondary | ICD-10-CM | POA: Diagnosis not present

## 2019-08-05 DIAGNOSIS — R29818 Other symptoms and signs involving the nervous system: Secondary | ICD-10-CM | POA: Diagnosis not present
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
## 2019-08-05 NOTE — Telephone Encounter (Signed)
Patient left vm that she could come for MR tomorrow. I have tried to reach Heidi Dach, MD at [facility] at [phone] multiple times. Number just rings and disconnects, no answer and no chance to leave message.

## 2019-08-05 NOTE — Telephone Encounter (Signed)
I finally got in touch with Eye doctor. They state diagnosis is suspected glaucoma in both eyes. They are faxing records.

## 2019-08-05 NOTE — Progress Notes (Signed)
Went to eye doctor on Monday - dilated eyes, numbing drops for glaucoma Vision changes - happened prior to appt (found optic nerve swollen/corneas thinning at eye visit) Facial numbness - started after eye doctor visit, she called them and they said to call us.  No other issues

## 2019-08-06 MED ORDER — PREDNISONE 50 MG PO TABS: ORAL_TABLET | ORAL | 0 refills | Status: DC

## 2019-08-06 MED ORDER — FAMOTIDINE 40 MG PO TABS: 40.0000 mg | ORAL_TABLET | Freq: Once | ORAL | 0 refills | Status: DC

## 2019-08-06 NOTE — Telephone Encounter (Signed)
Dr Lyn Hollingshead,   Lesly Rubenstein ordered a MR brain w and w/o for this patient to rule out MS. Lesly Rubenstein not in office today. Patient's scan is tomorrow.   Patient has contrast allergy and also allergy to benadryl. The usual prep is:  50 mg prednisone 13, 7, and 1 hour before injection 50 mg Benadryl 1 hour prior to injection  Please advise on ideas for prep.Marland KitchenMarland Kitchen

## 2019-08-06 NOTE — Telephone Encounter (Signed)
Sent prednisone and pepcid, the pepcid may not be as good as benadryl, if radiology has any other recommendations i'd follow those. If there is intolerance to  Benadryl rather than true allergic reaction, would take benadryl - not listed in her allergies so not sure what her reaction is

## 2019-08-06 NOTE — Telephone Encounter (Signed)
Records received and placed in box for Vickie Morgan's review. It looks like patient is scheduled for MR 08/09/2019.

## 2019-08-06 NOTE — Telephone Encounter (Signed)
Called and advised patient. She states that previous instance with contrast was just itching, no rash/swelling/redness.   Patient will do premed. Imaging is aware

## 2019-08-09 ENCOUNTER — Other Ambulatory Visit: Payer: BC Managed Care – PPO

## 2019-08-09 DIAGNOSIS — Z20828 Contact with and (suspected) exposure to other viral communicable diseases: Secondary | ICD-10-CM | POA: Diagnosis not present

## 2019-08-10 ENCOUNTER — Telehealth (INDEPENDENT_AMBULATORY_CARE_PROVIDER_SITE_OTHER): Payer: BC Managed Care – PPO | Admitting: Family Medicine

## 2019-08-10 ENCOUNTER — Encounter: Payer: Self-pay | Admitting: Family Medicine

## 2019-08-10 VITALS — HR 90 | Temp 97.6°F | Wt 246.0 lb

## 2019-08-10 DIAGNOSIS — R0602 Shortness of breath: Secondary | ICD-10-CM | POA: Diagnosis not present

## 2019-08-10 DIAGNOSIS — Z20822 Contact with and (suspected) exposure to covid-19: Secondary | ICD-10-CM | POA: Diagnosis not present

## 2019-08-10 MED ORDER — ALBUTEROL SULFATE HFA 108 (90 BASE) MCG/ACT IN AERS: 1.0000 | INHALATION_SPRAY | Freq: Four times a day (QID) | RESPIRATORY_TRACT | 0 refills | Status: DC | PRN

## 2019-08-10 NOTE — Progress Notes (Signed)
Virtual Visit via Video Note  I connected with Vickie Morgan on 08/10/19 at  1:40 PM EST by a video enabled telemedicine application and verified that I am speaking with the correct person using two identifiers.   I discussed the limitations of evaluation and management by telemedicine and the availability of in person appointments. The patient expressed understanding and agreed to proceed.  Subjective:    CC: possible covid  HPI: sxs began 3 days ago (Sunday). She stated that she woke up Sunday morning and couldn't taste or smell. She also reported that she was experiencing some lower back pain that started about 3 days prior to that.  She has now been experiencing low-grade fevers as well as body aches particularly on the lower half of her body.  She denies any sore throat.  She also feels very short of breath with activity. The SOB started this morning.  She denies any chest pain with it.  Though it is a little uncomfortable to take a real deep breath.  She denies any pain in her jaw,neck, chest or arms. She said that the SOB happens more upon exertion. Pt does report Hx of anxiety and doesn't feel that this is related.  She actually has a home pulse oximeter and has been checking it.  She says right now etc. 100% but did drop as low as 93%.  She notices that when she gets up just to let the dog out or any minimal activity she feels more short of breath and her heart rate increases.  She says the highest pulse that she is seen is about 140.  She reports that she had pneumonia years ago from H1 N1 and says it does not quite feel like that.  No cough.  She is been trying to hydrate well.  She also informed me that she had a low grade fever this morning 99.8. she did not take any fever reducing medications and her temperature has gone down.  Pt stated that she had a COVID test done yesterday with [facility]. She informed me that it was a PCR test.    Past medical history, Surgical history,  Family history not pertinant except as noted below, Social history, Allergies, and medications have been entered into the medical record, reviewed, and corrections made.   Review of Systems: No fevers, chills, night sweats, weight loss, chest pain, or shortness of breath.   Objective:    General: Speaking clearly in complete sentences without any shortness of breath.  Alert and oriented x3.  Normal judgment. No apparent acute distress.    Impression and Recommendations:   Suspect COVID.  She did get PCR tested yesterday so hopefully will have the results back tomorrow or the following day did encourage her to let us know.  Recommend symptomatic care with Tylenol or ibuprofen as needed for fever and body aches.  Also encouraged her to hydrate well especially since she has been getting some leg cramps.  Offered to send over albuterol inhaler since she has been noticing a little bit of wheezing with the shortness of breath or no chest pain.  If at any point she feels like she is getting worse then please let us know or if her oxygen level is dropping less than 90% and she needs to seek emergency care.  SOB - see note above.     I discussed the assessment and treatment plan with the patient. The patient was provided an opportunity to ask questions and all were  answered. The patient agreed with the plan and demonstrated an understanding of the instructions.   The patient was advised to call back or seek an in-person evaluation if the symptoms worsen or if the condition fails to improve as anticipated.   Vickie Gasser, MD

## 2019-08-10 NOTE — Progress Notes (Signed)
sxs began 3 days ago (Sunday). She stated that she woke up Sunday morning and couldn't taste or smell. She also reported that she was experiencing some lower back pain.   The SOB started this morning. She denies any pain in her jaw,neck, chest or arms. She said that the SOB happens more upon exertion. Pt does report Hx of anxiety and doesn't feel that this is related.   She also informed me that she had a low grade fever this morning 99.8. she did not take any fever reducing medications and her temperature has gone down.  Pt stated that she had a COVID test done yesterday with [facility]. She informed me that it was a PCR test.

## 2019-08-11 ENCOUNTER — Encounter (INDEPENDENT_AMBULATORY_CARE_PROVIDER_SITE_OTHER): Payer: Self-pay

## 2019-08-11 ENCOUNTER — Encounter: Payer: Self-pay | Admitting: Family Medicine

## 2019-08-11 ENCOUNTER — Telehealth: Payer: Self-pay

## 2019-08-11 NOTE — Telephone Encounter (Signed)
Patient states that she has IBS and and that she normal has diarrhea but it's worst. Patient states that she takes a over the counter supplement that seems to help. Patient will continue to monitor her symptoms    IF PATIENT HAS WORSENING WEAKNESS WITH INABILITY TO STAND OR IF PATIENT HAS TO HOLD ON TO SOMETHING TO GET BALANCE, ADVISE PATIENT TO CALL 911 AND SEEK TREATMENT IN ED    If diarrhea remains the same: encourage patient to drink oral fluids and bland foods.   Avoid alcohol, spicy foods, caffeine or fatty foods that could make diarrhea worse.   Continue to monitor for signs of dehydration (increased thirst decreased urine output, yellow urine, dry skin, headache or dizziness).   Advise patient to try OTC medication (Imodium, kaopectate, Pepto-Bismol) as per manufacturer's instructions.    If worsening diarrhea occurs and becomes severe (6-7 bowel movements a day): notify PCP   If diarrhea last greater than 7 days: notify PCP   IF SIGNS OF DEHYDRATION OCCUR (INCREASED THIRST, DECREASED URINE OUTPUT, YELLOW URINE, DRY SKIN, HEADACHE OR DIZZINESS) ADVISE PATIENT TO CALL 911 AND SEEK TREATMENT IN THE ED   If cough remains the same or better: continue to treat with over the counter medications. Hard candy or cough drops and drinking warm fluids. Adults can also use honey 2 tsp (10 ML) at bedtime.   HONEY IS NOT RECOMMENDED FOR INFANTS UNDER ONE.   If cough is becoming worse even with the use of over the counter medications and patient is not able to sleep at night, cough becomes productive with sputum that maybe yellow or green in color, contact PCP.

## 2019-08-12 ENCOUNTER — Encounter (INDEPENDENT_AMBULATORY_CARE_PROVIDER_SITE_OTHER): Payer: Self-pay

## 2019-08-12 MED ORDER — AZITHROMYCIN 250 MG PO TABS: ORAL_TABLET | ORAL | 0 refills | Status: AC

## 2019-08-12 NOTE — Telephone Encounter (Signed)
Glad Covid negative.  We will send over antibiotic.  If not better after the weekend please give [place] call back and schedule virtual visit

## 2019-08-12 NOTE — Telephone Encounter (Signed)
I have not seen this patient since September, looks like she had a virtual visit with Dr. Eppie Gibson.  Will defer question to Dr. Glade Lloyd for now since she was last to see the patient, Dr. Eppie Gibson did send in albuterol inhaler vies patient to contact us if worse.  If Covid testing was negative, could certainly be other viral condition or false negative test or other cause of cough.

## 2019-08-13 ENCOUNTER — Encounter (INDEPENDENT_AMBULATORY_CARE_PROVIDER_SITE_OTHER): Payer: Self-pay

## 2019-08-14 ENCOUNTER — Encounter (INDEPENDENT_AMBULATORY_CARE_PROVIDER_SITE_OTHER): Payer: Self-pay

## 2019-08-15 ENCOUNTER — Encounter (INDEPENDENT_AMBULATORY_CARE_PROVIDER_SITE_OTHER): Payer: Self-pay

## 2019-08-16 ENCOUNTER — Encounter (INDEPENDENT_AMBULATORY_CARE_PROVIDER_SITE_OTHER): Payer: Self-pay

## 2019-08-17 ENCOUNTER — Encounter (INDEPENDENT_AMBULATORY_CARE_PROVIDER_SITE_OTHER): Payer: Self-pay

## 2019-08-18 ENCOUNTER — Encounter (INDEPENDENT_AMBULATORY_CARE_PROVIDER_SITE_OTHER): Payer: Self-pay

## 2019-08-20 ENCOUNTER — Encounter (INDEPENDENT_AMBULATORY_CARE_PROVIDER_SITE_OTHER): Payer: Self-pay

## 2019-08-21 ENCOUNTER — Encounter (INDEPENDENT_AMBULATORY_CARE_PROVIDER_SITE_OTHER): Payer: Self-pay

## 2019-08-22 ENCOUNTER — Encounter (INDEPENDENT_AMBULATORY_CARE_PROVIDER_SITE_OTHER): Payer: Self-pay

## 2019-08-23 ENCOUNTER — Encounter (INDEPENDENT_AMBULATORY_CARE_PROVIDER_SITE_OTHER): Payer: Self-pay

## 2019-08-23 IMAGING — CT CT ABDOMEN AND PELVIS WITH CONTRAST
2 of 4 series · 17 of 46 positions shown, 19 images · IV contrast (ISOVUE)
Comparison: None.

CLINICAL DATA: 37-year-old female with right lower quadrant
abdominal pain.

EXAM:
CT ABDOMEN AND PELVIS WITH CONTRAST
TECHNIQUE: Multidetector CT imaging of the abdomen and pelvis was performed
using the standard protocol following bolus administration of
intravenous contrast.
CONTRAST:  100mL OMNIPAQUE IOHEXOL 300 MG/ML  SOLN

[Series 2: axial st · axial · 0.78mm/px · z∈[+336,+766]mm · 14 of 96 slices shown, 16 images]
[im 5/96  soft-tissue]
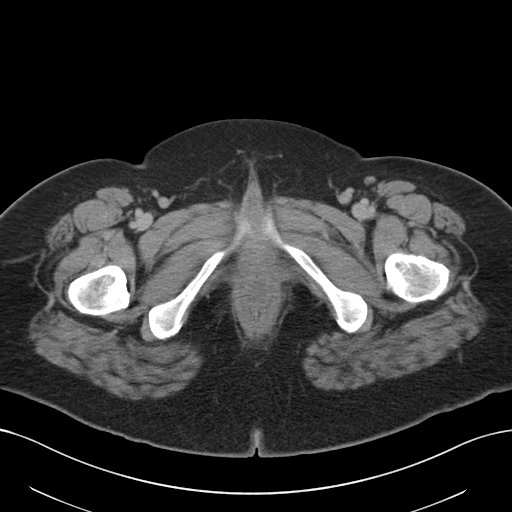
[im 5/96  bone]
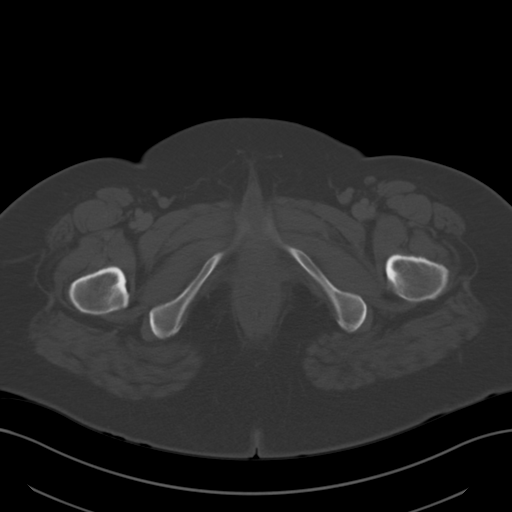
[im 15/96  soft-tissue]
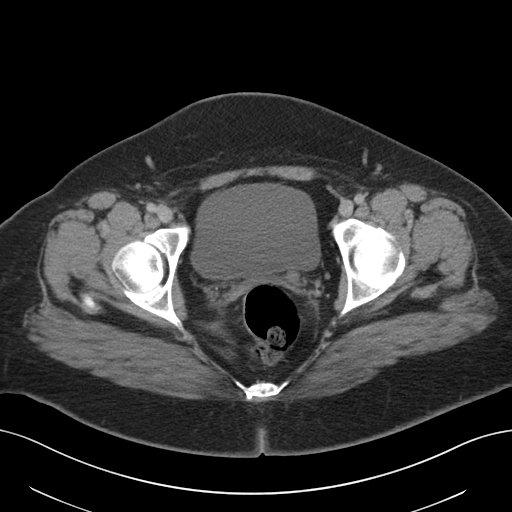
[im 20/96  soft-tissue]
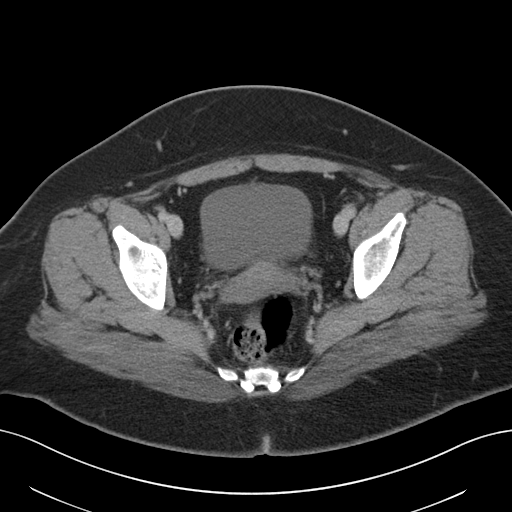
[im 24/96  soft-tissue]
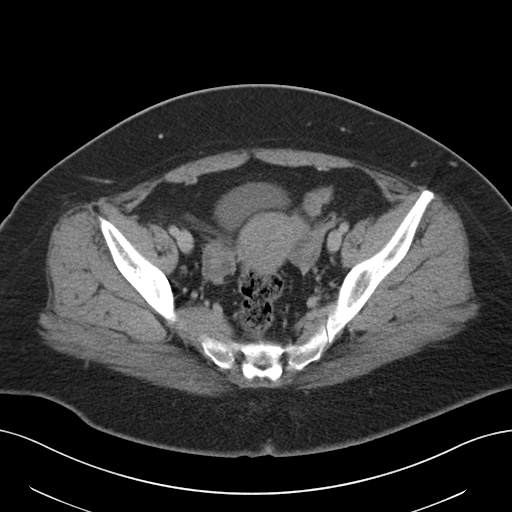
[im 34/96  soft-tissue]
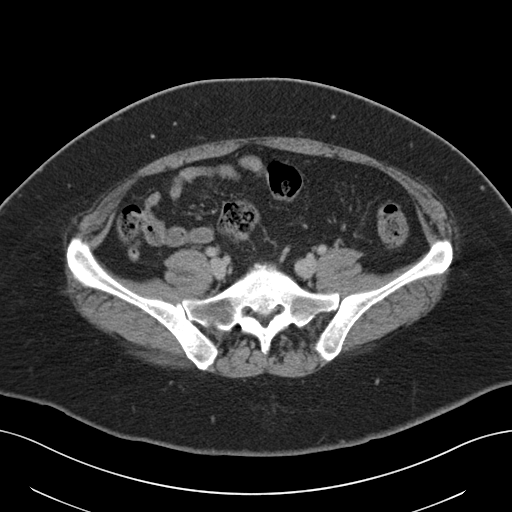
[im 39/96  soft-tissue]
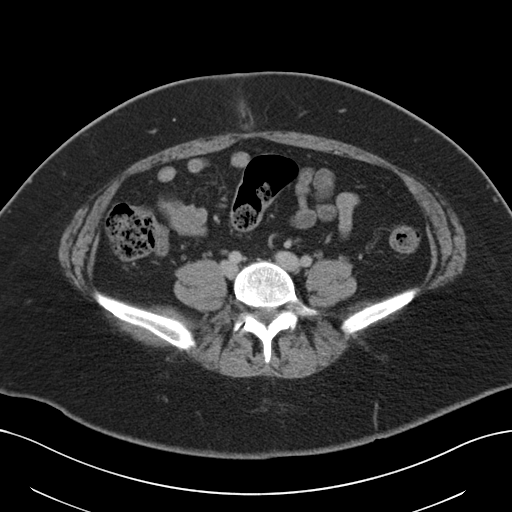
[im 43/96  soft-tissue]
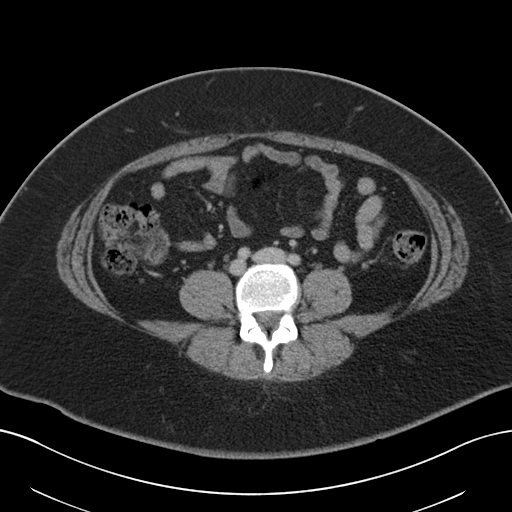
[im 53/96  soft-tissue]
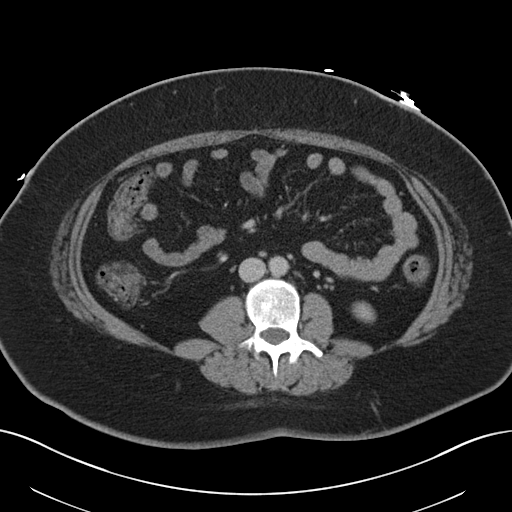
[im 58/96  soft-tissue]
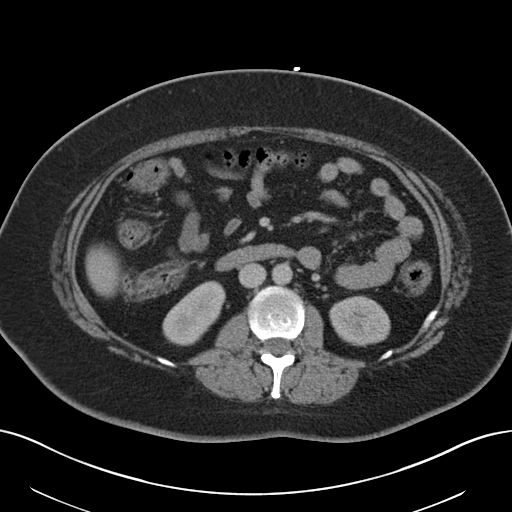
[im 58/96  bone]
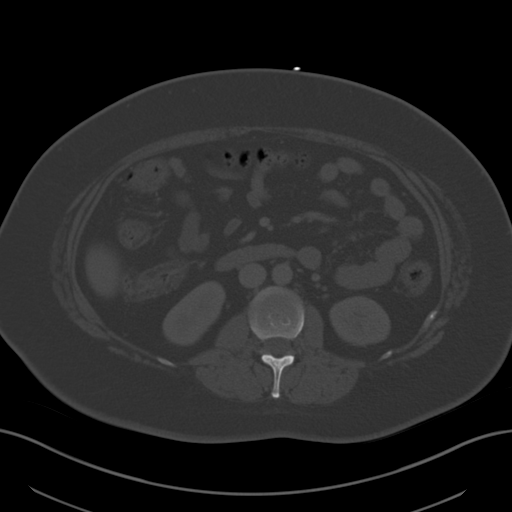
[im 62/96  soft-tissue]
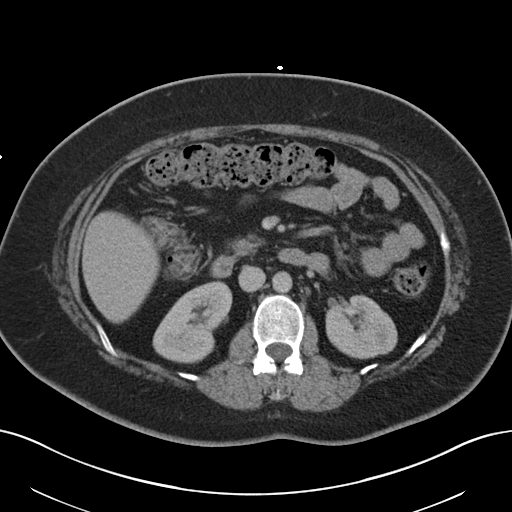
[im 72/96  soft-tissue]
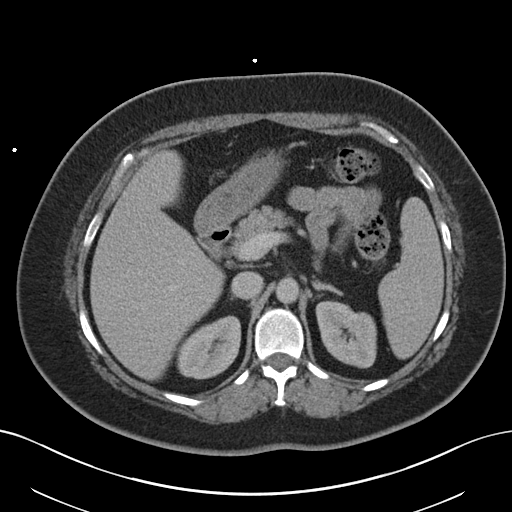
[im 77/96  soft-tissue]
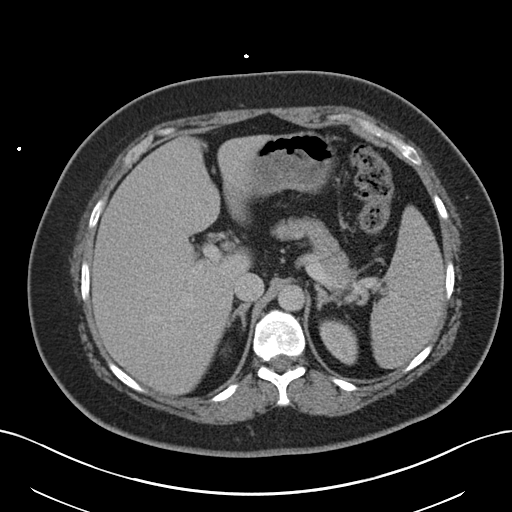
[im 81/96  soft-tissue]
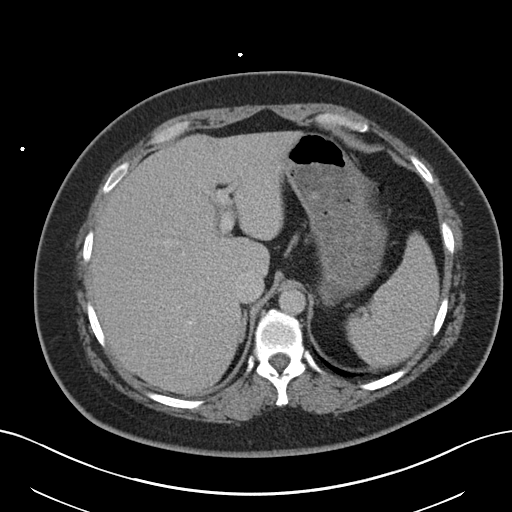
[im 91/96  soft-tissue]
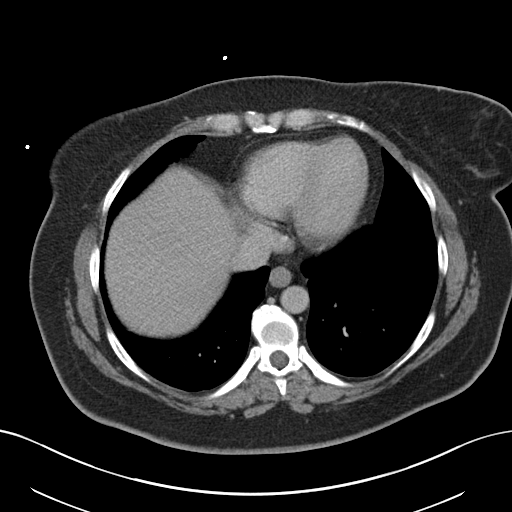

[Series 5: coronal st · coronal · 0.84mm/px · 3 of 147 slices shown]
[im 49/147  soft-tissue]
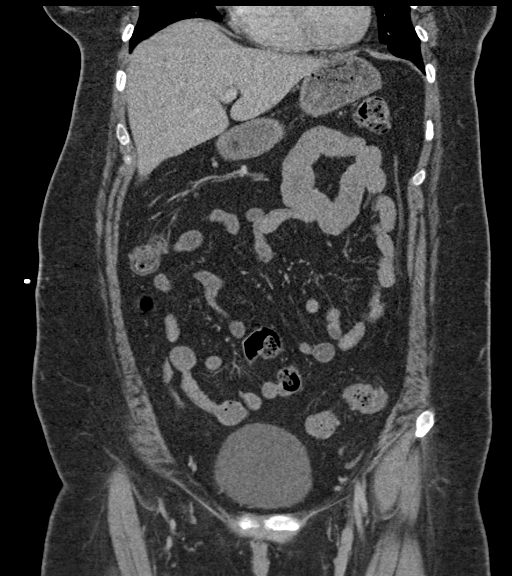
[im 65/147  soft-tissue]
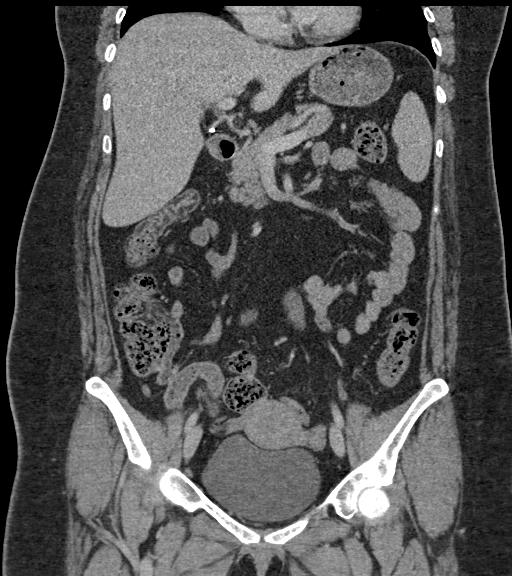
[im 82/147  soft-tissue]
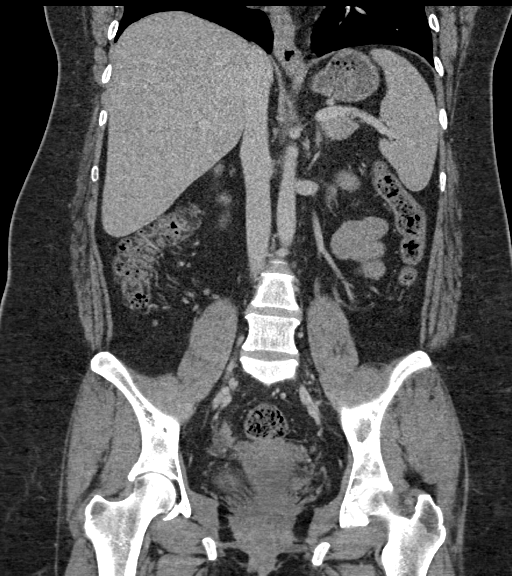

[17 of 46 positions shown; findings below may reference images not displayed]

FINDINGS: Lower chest: The visualized lung bases are clear.

No intra-abdominal free air or free fluid.

Hepatobiliary: The liver is unremarkable. No intrahepatic biliary
ductal dilatation. Cholecystectomy.

Pancreas: Unremarkable. No pancreatic ductal dilatation or
surrounding inflammatory changes.

Spleen: Normal in size without focal abnormality.

Adrenals/Urinary Tract: The adrenal glands are unremarkable. There
is no hydronephrosis on either side. The visualized ureters and
urinary bladder appear unremarkable.

Stomach/Bowel: There is moderate stool throughout the colon. Small
scattered colonic diverticula without active inflammatory changes.
There is no bowel obstruction or active inflammation. Normal
appendix.

Vascular/Lymphatic: The abdominal aorta and IVC are unremarkable. No
portal venous gas. There is no adenopathy.

Reproductive: The uterus and ovaries are grossly unremarkable. No
pelvic mass.

Other: None

Musculoskeletal: No acute or significant osseous findings.
IMPRESSION: No acute intra-abdominal or pelvic pathology. No bowel obstruction
or active inflammation. Normal appendix.

## 2019-08-24 ENCOUNTER — Other Ambulatory Visit: Payer: Self-pay

## 2019-08-24 ENCOUNTER — Ambulatory Visit (INDEPENDENT_AMBULATORY_CARE_PROVIDER_SITE_OTHER): Payer: BC Managed Care – PPO

## 2019-08-24 ENCOUNTER — Telehealth (INDEPENDENT_AMBULATORY_CARE_PROVIDER_SITE_OTHER): Payer: BC Managed Care – PPO | Admitting: Family Medicine

## 2019-08-24 VITALS — HR 92 | Temp 99.1°F | Wt 246.0 lb

## 2019-08-24 DIAGNOSIS — M5440 Lumbago with sciatica, unspecified side: Secondary | ICD-10-CM

## 2019-08-24 DIAGNOSIS — R0789 Other chest pain: Secondary | ICD-10-CM | POA: Diagnosis not present

## 2019-08-24 DIAGNOSIS — Z86718 Personal history of other venous thrombosis and embolism: Secondary | ICD-10-CM

## 2019-08-24 DIAGNOSIS — R0602 Shortness of breath: Secondary | ICD-10-CM | POA: Diagnosis not present

## 2019-08-24 DIAGNOSIS — R5383 Other fatigue: Secondary | ICD-10-CM | POA: Diagnosis not present

## 2019-08-24 DIAGNOSIS — M545 Low back pain: Secondary | ICD-10-CM | POA: Diagnosis not present

## 2019-08-24 NOTE — Progress Notes (Signed)
Virtual Visit via Video Note  I connected with Vickie Morgan on 08/24/19 at  2:40 PM EST by a video enabled telemedicine application and verified that I am speaking with the correct person using two identifiers.   I discussed the limitations of evaluation and management by telemedicine and the availability of in person appointments. The patient expressed understanding and agreed to proceed.  Subjective:    CC: still sick   HPI:  39 year old female is following up because she still having some upper respiratory symptoms.  She was seen on January 19 for upper respiratory symptoms that included some shortness of breath.  She had actually been swabbed for Covid the day before.  She called [place] back on the 20th and let us know that her Covid test was negative so at that point we called in a Z-Pak.  She said initially she felt much better after the antibiotic she says it took a little while but started to improve.  But over the last week or so she feels like she is actually starting to feel more short of breath.  She still feels weak.  The shortness of breath is mostly with activity and it is better at rest.  She has been using the albuterol inhaler had sent in for her and says that it does seem to help.  She denies any wheezing.  She said she ran a low-grade temperature this morning of 99.1 which is partly why she called.    Pt reports that she was given an ABX, zpack,  that she finished on yesterday and the SOB has been on going x 2 weeks. Denies any cough, chest tightness, f/s/c. Her COVID test was negative.    She states that when she takes a breath either normal or deep she gets pain in her R lung.  She says it feels similar to when you go out in the really cold air and get almost a burning sensation deep in your lungs.  She just feels like it is towards the right axillary side.   She also complains of some persistent low back pain it is mostly where the hips meet the spine she says it is mostly  bilateral and not directly over the spine.  She says it is very different from her usual back pain this is been going on for a few weeks as well.  She says it does radiate into her legs bilaterally especially at night.  She denies any urinary symptoms.  She says it is bothersome enough that is actually waking her up in the middle of the night.  She does have a prior history of blood clots from birth control years ago.  She stated that she has been running low grade temps.    Past medical history, Surgical history, Family history not pertinant except as noted below, Social history, Allergies, and medications have been entered into the medical record, reviewed, and corrections made.   Review of Systems: No fevers, chills, night sweats, weight loss, chest pain, or shortness of breath.   Objective:    General: Speaking clearly in complete sentences without any shortness of breath.  Alert and oriented x3.  Normal judgment. No apparent acute distress.    Impression and Recommendations:      Shortness of breath-unclear etiology.  She has been sick for couple weeks now did seem to respond at least temporarily to the Z-Pak.  She tested negative for Covid's with this point I like to move forward with chest  x-ray for further work-up and evaluation.  She does also have a prior history of a DVT on birth control so would like to check a D-dimer as well.  Fatigue - Check a CBC with differential to look for infection versus anemia.  Right-sided chest pain -right-sided chest pain towards the axilla.  She says it feels like a burning sensation like when you get in cold air and your breathing heavily.  Again unclear etiology so we will start with chest x-ray and consider pulmonary embolism will check D-dimer level.  Low back pain, acute, bilateral-unclear etiology it sounds like it is different than her baseline back pain.  No recent injury or trauma or heavy lifting to cause it.  Cervical had a move forward  with low back x-ray as well.  It sounds like she is getting some radiculopathy bilaterally.  If unrevealing then we will try to get her in with one of our sports med docs for further work-up or consider physical therapy.  I discussed the assessment and treatment plan with the patient. The patient was provided an opportunity to ask questions and all were answered. The patient agreed with the plan and demonstrated an understanding of the instructions.   The patient was advised to call back or seek an in-person evaluation if the symptoms worsen or if the condition fails to improve as anticipated.   Nani Gasser, MD

## 2019-08-24 NOTE — Progress Notes (Signed)
Pt reports that she was given an ABX that she finished on yesterday and the SOB has been on going x 2 weeks.  Denies any cough, chest tightness, f/s/c  She states that when she takes a breath either normal or deep she gets pain in her R lung. She continues to experience lower back pain.    She stated that she has been running low grade temps.

## 2019-08-25 ENCOUNTER — Encounter: Payer: Self-pay | Admitting: Family Medicine

## 2019-08-25 LAB — CBC
HCT: 40 % (ref 35.0–45.0)
Hemoglobin: 13.2 g/dL (ref 11.7–15.5)
MCH: 27.6 pg (ref 27.0–33.0)
MCHC: 33 g/dL (ref 32.0–36.0)
MCV: 83.7 fL (ref 80.0–100.0)
MPV: 12.5 fL (ref 7.5–12.5)
Platelets: 244 10*3/uL (ref 140–400)
RBC: 4.78 10*6/uL (ref 3.80–5.10)
RDW: 13.4 % (ref 11.0–15.0)
WBC: 9.8 10*3/uL (ref 3.8–10.8)

## 2019-08-25 LAB — URINALYSIS, ROUTINE W REFLEX MICROSCOPIC
Bilirubin Urine: NEGATIVE
Hgb urine dipstick: NEGATIVE
Ketones, ur: NEGATIVE
Leukocytes,Ua: NEGATIVE
Nitrite: NEGATIVE
Protein, ur: NEGATIVE
Specific Gravity, Urine: 1.025 (ref 1.001–1.03)
pH: 6.5 (ref 5.0–8.0)

## 2019-08-25 LAB — COMPLETE METABOLIC PANEL WITH GFR
AG Ratio: 1.7 (calc) (ref 1.0–2.5)
ALT: 13 U/L (ref 6–29)
AST: 10 U/L (ref 10–30)
Albumin: 4 g/dL (ref 3.6–5.1)
Alkaline phosphatase (APISO): 65 U/L (ref 31–125)
BUN: 7 mg/dL (ref 7–25)
CO2: 26 mmol/L (ref 20–32)
Calcium: 8.8 mg/dL (ref 8.6–10.2)
Chloride: 105 mmol/L (ref 98–110)
Creat: 0.62 mg/dL (ref 0.50–1.10)
GFR, Est African American: 133 mL/min/{1.73_m2} (ref >=60)
GFR, Est Non African American: 114 mL/min/{1.73_m2} (ref >=60)
Globulin: 2.4 g/dL (calc) (ref 1.9–3.7)
Glucose, Bld: 137 mg/dL — ABNORMAL HIGH (ref 65–99)
Potassium: 3.7 mmol/L (ref 3.5–5.3)
Sodium: 140 mmol/L (ref 135–146)
Total Bilirubin: 0.3 mg/dL (ref 0.2–1.2)
Total Protein: 6.4 g/dL (ref 6.1–8.1)

## 2019-08-25 LAB — D-DIMER, QUANTITATIVE: D-Dimer, Quant: 0.31 mcg/mL FEU (ref <0.50)

## 2019-09-06 DIAGNOSIS — H40003 Preglaucoma, unspecified, bilateral: Secondary | ICD-10-CM | POA: Diagnosis not present

## 2019-09-08 ENCOUNTER — Encounter: Payer: Self-pay | Admitting: Emergency Medicine

## 2019-09-08 ENCOUNTER — Other Ambulatory Visit: Payer: Self-pay

## 2019-09-08 ENCOUNTER — Emergency Department (INDEPENDENT_AMBULATORY_CARE_PROVIDER_SITE_OTHER)
Admission: EM | Admit: 2019-09-08 | Discharge: 2019-09-08 | Disposition: A | Discharge status: Home / Self Care | Payer: BC Managed Care – PPO | Source: Home / Self Care | Attending: Family Medicine | Admitting: Family Medicine

## 2019-09-08 DIAGNOSIS — B029 Zoster without complications: Secondary | ICD-10-CM | POA: Diagnosis not present

## 2019-09-08 MED ORDER — VALACYCLOVIR HCL 1 G PO TABS: ORAL_TABLET | ORAL | 0 refills | Status: DC

## 2019-09-08 NOTE — Discharge Instructions (Addendum)
May continue Naproxen as needed for pain
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
## 2019-09-08 NOTE — ED Triage Notes (Signed)
Rash on Left upper back left chin started last night, Itches, burns and hurts. HX of shingles

## 2019-09-09 ENCOUNTER — Telehealth: Payer: BC Managed Care – PPO | Admitting: Nurse Practitioner

## 2019-09-13 ENCOUNTER — Encounter: Payer: Self-pay | Admitting: Osteopathic Medicine

## 2019-11-09 ENCOUNTER — Ambulatory Visit (INDEPENDENT_AMBULATORY_CARE_PROVIDER_SITE_OTHER): Payer: BC Managed Care – PPO | Admitting: Osteopathic Medicine

## 2019-11-09 ENCOUNTER — Encounter: Payer: Self-pay | Admitting: Osteopathic Medicine

## 2019-11-09 ENCOUNTER — Other Ambulatory Visit: Payer: Self-pay

## 2019-11-09 VITALS — BP 138/86 | HR 90 | Temp 98.1°F | Wt 256.1 lb

## 2019-11-09 DIAGNOSIS — K529 Noninfective gastroenteritis and colitis, unspecified: Secondary | ICD-10-CM | POA: Diagnosis not present

## 2019-11-09 DIAGNOSIS — R7301 Impaired fasting glucose: Secondary | ICD-10-CM

## 2019-11-09 DIAGNOSIS — E139 Other specified diabetes mellitus without complications: Secondary | ICD-10-CM | POA: Diagnosis not present

## 2019-11-09 DIAGNOSIS — N926 Irregular menstruation, unspecified: Secondary | ICD-10-CM

## 2019-11-09 LAB — COMPLETE METABOLIC PANEL WITH GFR
AG Ratio: 1.7 (calc) (ref 1.0–2.5)
ALT: 24 U/L (ref 6–29)
AST: 15 U/L (ref 10–30)
Albumin: 3.9 g/dL (ref 3.6–5.1)
Alkaline phosphatase (APISO): 63 U/L (ref 31–125)
BUN: 9 mg/dL (ref 7–25)
CO2: 28 mmol/L (ref 20–32)
Calcium: 9.2 mg/dL (ref 8.6–10.2)
Chloride: 104 mmol/L (ref 98–110)
Creat: 0.61 mg/dL (ref 0.50–1.10)
GFR, Est African American: 133 mL/min/{1.73_m2} (ref >=60)
GFR, Est Non African American: 115 mL/min/{1.73_m2} (ref >=60)
Globulin: 2.3 g/dL (calc) (ref 1.9–3.7)
Glucose, Bld: 154 mg/dL — ABNORMAL HIGH (ref 65–139)
Potassium: 3.7 mmol/L (ref 3.5–5.3)
Sodium: 138 mmol/L (ref 135–146)
Total Bilirubin: 0.4 mg/dL (ref 0.2–1.2)
Total Protein: 6.2 g/dL (ref 6.1–8.1)

## 2019-11-09 LAB — POCT GLYCOSYLATED HEMOGLOBIN (HGB A1C): Hemoglobin A1C: 7.5 % — AB (ref 4.0–5.6)

## 2019-11-09 LAB — POCT URINALYSIS DIP (CLINITEK)
Bilirubin, UA: NEGATIVE
Blood, UA: NEGATIVE
Glucose, UA: 100 mg/dL — AB
Ketones, POC UA: NEGATIVE mg/dL
Leukocytes, UA: NEGATIVE
Nitrite, UA: NEGATIVE
POC PROTEIN,UA: NEGATIVE
Spec Grav, UA: >=1.03 — AB (ref 1.010–1.025)
Urobilinogen, UA: 0.2 E.U./dL
pH, UA: 7 (ref 5.0–8.0)

## 2019-11-09 LAB — GLUCOSE, POCT (MANUAL RESULT ENTRY): POC Glucose: 178 mg/dl — AB (ref 70–99)

## 2019-11-09 MED ORDER — OZEMPIC (0.25 OR 0.5 MG/DOSE) 2 MG/1.5ML ~~LOC~~ SOPN: 0.5000 mg | PEN_INJECTOR | SUBCUTANEOUS | 1 refills | Status: DC

## 2019-11-09 MED ORDER — RIFAXIMIN 200 MG PO TABS: 200.0000 mg | ORAL_TABLET | Freq: Three times a day (TID) | ORAL | 0 refills | Status: AC

## 2019-11-09 NOTE — Progress Notes (Signed)
Vickie Morgan is a 39 y.o. female who presents to  [facility] at [facility]  today, 11/09/19, seeking care for the following: . Elevated FBG at home into 240's  o hx Prediabetes, metformin caused some side effects. Labs reviewed: Glc on CMP/BMP have been ok, no fasting labs recently o Patient was feeling a bit tired over the weekend, feeling almost week, she decided to check blood sugar, was up in the 280s maybe 5 or 6 hours after eating anything.  No polyuria/polydipsia. o History of gestational diabetes multiple occasions, never had to be on insulin injections.  Family history of type 1 diabetes.     ASSESSMENT & PLAN with other pertinent history/findings:  The primary encounter diagnosis was Elevated fasting blood sugar. 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  A1C up, and positive glycosuria on labs in office today.  Patient does desire preservation of fertility, would like to get her into endocrinology to discuss late onset type I versus type II, best medications for pregnancy since she has a history of Metformin intolerance, I believe she may need to be on insulin.  Should we do not have any other sugars to compare to but good to 88 number at home is concerning, patient is advised to keep track of fasting sugars and also check as needed if any concerning symptoms.  Patient also mentions chronic diarrhea, previous diagnosis of IBS, intolerant to dicyclomine/Bentyl which caused hallucinations, Imodium is not working, dietary modifications have also not been helpful other than dairy elimination seems to alleviate things somewhat, she has not tried a low FODMAP diet.  Information on this was provided today, will trial Xifaxan and if this is not helpful would consider long-term treatment, again  depends on fertility goals.  Results for orders placed or performed in visit on 11/09/19 (from the past 24 hour(s))  POCT Glucose (CBG)     Status: Abnormal   Collection Time: 11/09/19 11:33 AM  Result Value Ref Range   POC Glucose 178 (A) 70 - 99 mg/dl  POCT HgB X5O     Status: Abnormal   Collection Time: 11/09/19 11:43 AM  Result Value Ref Range   Hemoglobin A1C 7.5 (A) 4.0 - 5.6 %   HbA1c POC (<> result, manual entry)     HbA1c, POC (prediabetic range)     HbA1c, POC (controlled diabetic range)    POCT URINALYSIS DIP (CLINITEK)     Status: Abnormal   Collection Time: 11/09/19 11:44 AM  Result Value Ref Range   Color, UA yellow yellow   Clarity, UA clear clear   Glucose, UA =100 (A) negative mg/dL   Bilirubin, UA negative negative   Ketones, POC UA negative negative mg/dL   Spec Grav, UA >=8.325 (A) 1.010 - 1.025   Blood, UA negative negative   pH, UA 7.0 5.0 - 8.0   POC PROTEIN,UA negative negative, trace   Urobilinogen, UA 0.2 0.2 or 1.0 E.U./dL   Nitrite, UA Negative Negative   Leukocytes, UA Negative Negative     Patient Instructions    2017 UpToDate Characteristics and sources of common FODMAPs  Word that corresponds to letter in acronym Compounds in this category Foods that contain these compounds  F Fermentable  O Oligosaccharides Fructans, galacto-oligosaccharides Wheat, barley, rye, onion, leek, white part of spring onion, garlic, shallots, artichokes, beetroot, fennel, peas, chicory,  pistachio, cashews, legumes, lentils, and chickpeas     D Disaccharides Lactose Milk, custard, ice cream, and yogurt     M Monosaccharides "Free fructose" (fructose in excess of glucose) Apples, pears, mangoes, cherries, watermelon, asparagus, sugar snap peas, honey, high-fructose corn syrup     A And  P Polyols Sorbitol, mannitol, maltitol, and xylitol Apples, pears, apricots, cherries, nectarines, peaches, plums, watermelon, mushrooms, cauliflower, artificially  sweetened chewing gum and confectionery     FODMAPs: fermentable oligosaccharides, disaccharides, monosaccharides, and polyols. Adapted by permission from [employer]: [employer]. Lonell Face, Lomer MC, [place]. Short-chain carbohydrates and functional gastrointestinal disorders. Am J Gastroenterol 2013; 108:707. Copyright  2013. www.nature.com/ajg. Graphic 36629 Version 2.0      Orders Placed This Encounter  Procedures  . COMPLETE METABOLIC PANEL WITH GFR  . Ambulatory referral to Endocrinology  . POCT Glucose (CBG)  . POCT HgB A1C  . POCT URINALYSIS DIP (CLINITEK)    Meds ordered this encounter  Medications  . rifaximin (XIFAXAN) 200 MG tablet    Sig: Take 1 tablet (200 mg total) by mouth 3 (three) times daily for 14 days.    Dispense:  42 tablet    Refill:  0  . Semaglutide,0.25 or 0.5MG /DOS, (OZEMPIC, 0.25 OR 0.5 MG/DOSE,) 2 MG/1.5ML SOPN    Sig: Inject 0.5 mg into the skin once a week. If severe nausea, decrease dose to 0.25 mg weekly    Dispense:  1 pen    Refill:  1       Follow-up instructions: Return in about 3 weeks (around 11/30/2019) for VIRTUAL VISIT RECHECK IBS AND DIABETES ON NEW MEDICATIONS .                                         BP 138/86 (BP Location: Left Arm, Patient Position: Sitting, Cuff Size: Large)   Pulse 90   Temp 98.1 F (36.7 C) (Oral)   Wt 256 lb 1.9 oz (116.2 kg)   BMI 40.11 kg/m   Current Meds  Medication Sig  . Multiple Vitamin (MULTI-VITAMIN) tablet Take by mouth.  . naproxen (NAPROSYN) 500 MG tablet Take 1 tablet (500 mg total) by mouth 2 (two) times daily with a meal.  . valACYclovir (VALTREX) 1000 MG tablet Take one tab PO Q8hr  . VITAMIN D, ERGOCALCIFEROL, PO Take 500 mg by mouth.    Results for orders placed or performed in visit on 11/09/19 (from the past 72 hour(s))  POCT Glucose (CBG)     Status: Abnormal   Collection Time: 11/09/19 11:33  AM  Result Value Ref Range   POC Glucose 178 (A) 70 - 99 mg/dl  POCT HgB U7M     Status: Abnormal   Collection Time: 11/09/19 11:43 AM  Result Value Ref Range   Hemoglobin A1C 7.5 (A) 4.0 - 5.6 %   HbA1c POC (<> result, manual entry)     HbA1c, POC (prediabetic range)     HbA1c, POC (controlled diabetic range)    POCT URINALYSIS DIP (CLINITEK)     Status: Abnormal   Collection Time: 11/09/19 11:44 AM  Result Value Ref Range   Color, UA yellow yellow   Clarity, UA clear clear   Glucose, UA =100 (A) negative mg/dL   Bilirubin, UA negative negative   Ketones, POC UA negative negative mg/dL   Spec Grav, UA >=5.465 (A) 1.010 -  1.025   Blood, UA negative negative   pH, UA 7.0 5.0 - 8.0   POC PROTEIN,UA negative negative, trace   Urobilinogen, UA 0.2 0.2 or 1.0 E.U./dL   Nitrite, UA Negative Negative   Leukocytes, UA Negative Negative    No results found.  Depression screen [facility] 2/9 01/18/2019  Decreased Interest 0  Down, Depressed, Hopeless 0  PHQ - 2 Score 0  Altered sleeping 1  Tired, decreased energy 1  Change in appetite 1  Feeling bad or failure about yourself  1  Trouble concentrating 3  Moving slowly or fidgety/restless 1  Suicidal thoughts 0  PHQ-9 Score 8  Difficult doing work/chores Somewhat difficult    GAD 7 : Generalized Anxiety Score 01/18/2019  Nervous, Anxious, on Edge 3  Control/stop worrying 2  Worry too much - different things 2  Trouble relaxing 3  Restless 3  Easily annoyed or irritable 2  Afraid - awful might happen 2  Total GAD 7 Score 17  Anxiety Difficulty Very difficult      All questions at time of visit were answered - patient instructed to contact office with any additional concerns or updates.  ER/RTC precautions were reviewed with the patient.  Please note: voice recognition software was used to produce this document, and typos may escape review. Please contact Dr. Sheppard Coil for any needed clarifications.

## 2019-11-09 NOTE — Patient Instructions (Signed)
  2017 UpToDate Characteristics and sources of common FODMAPs  Word that corresponds to letter in acronym Compounds in this category Foods that contain these compounds  F Fermentable  O Oligosaccharides Fructans, galacto-oligosaccharides Wheat, barley, rye, onion, leek, white part of spring onion, garlic, shallots, artichokes, beetroot, fennel, peas, chicory, pistachio, cashews, legumes, lentils, and chickpeas  D Disaccharides Lactose Milk, custard, ice cream, and yogurt  M Monosaccharides "Free fructose" (fructose in excess of glucose) Apples, pears, mangoes, cherries, watermelon, asparagus, sugar snap peas, honey, high-fructose corn syrup  A And  P Polyols Sorbitol, mannitol, maltitol, and xylitol Apples, pears, apricots, cherries, nectarines, peaches, plums, watermelon, mushrooms, cauliflower, artificially sweetened chewing gum and confectionery  FODMAPs: fermentable oligosaccharides, disaccharides, monosaccharides, and polyols. Adapted by permission from [employer]: American Journal of Gastroenterology. Shepherd SJ, Lomer MC, [place]. Short-chain carbohydrates and functional gastrointestinal disorders. Am J Gastroenterol 2013; 108:707. Copyright  2013. www.nature.com/ajg. Graphic 90186 Version 2.0  ......... 

## 2019-11-10 ENCOUNTER — Encounter: Payer: Self-pay | Admitting: Osteopathic Medicine

## 2019-11-11 ENCOUNTER — Telehealth: Payer: Self-pay | Admitting: Osteopathic Medicine

## 2019-11-11 NOTE — Telephone Encounter (Signed)
Received fax for PA on Xifaxan sent through cover my meds waiting on determination. - CF 

## 2019-11-12 IMAGING — US US PELVIS COMPLETE WITH TRANSVAGINAL
1 series · 14 of 25 positions shown · non-contrast
Comparison: None

CLINICAL DATA: Generalized pelvic pain.

EXAM:
TRANSABDOMINAL AND TRANSVAGINAL ULTRASOUND OF PELVIS
TECHNIQUE: Both transabdominal and transvaginal ultrasound examinations of the
pelvis were performed. Transabdominal technique was performed for
global imaging of the pelvis including uterus, ovaries, adnexal
regions, and pelvic cul-de-sac. It was necessary to proceed with
endovaginal exam following the transabdominal exam to visualize the
adnexal structures.

[Series 1: us pelvis complete with transvaginal · 14 of 56 slices shown]
[im 1/56]
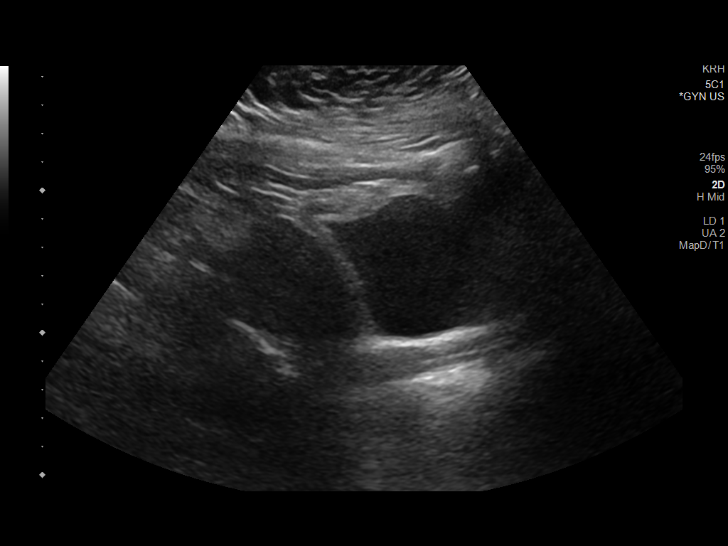
[im 5/56]
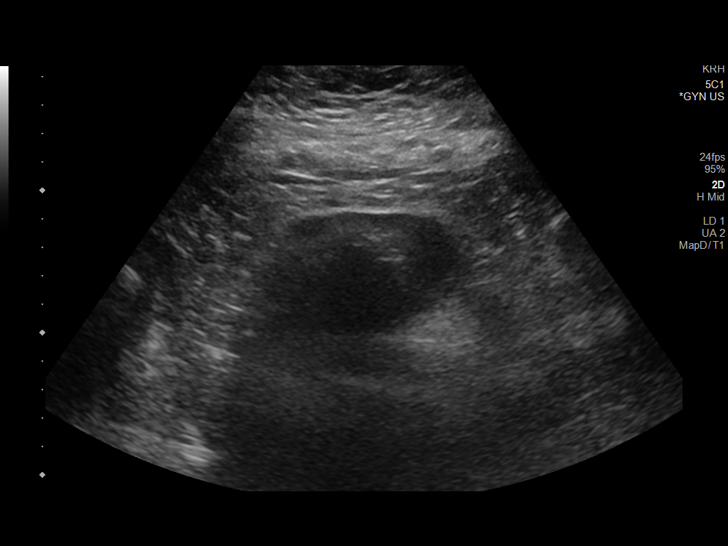
[im 10/56]
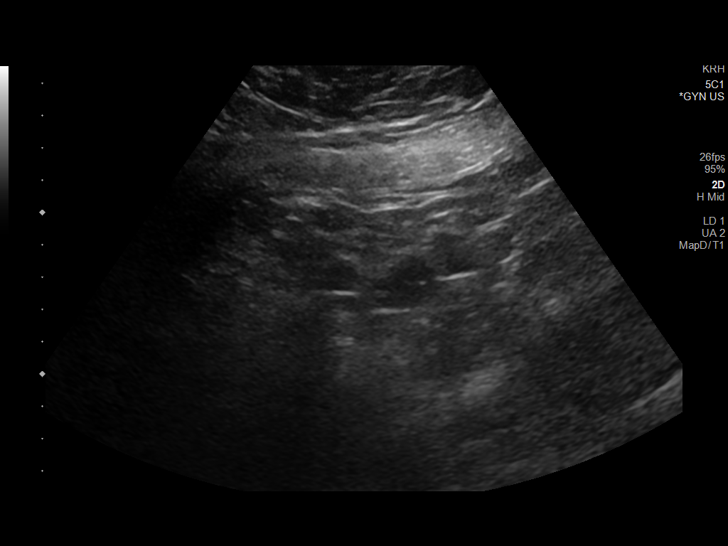
[im 14/56]
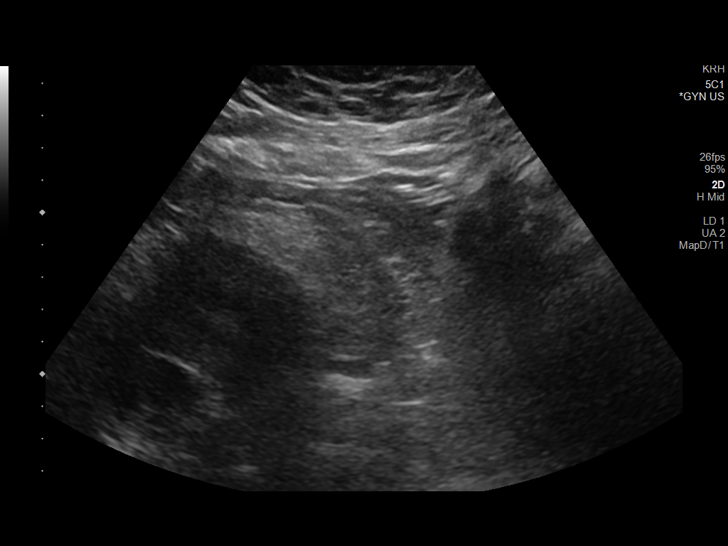
[im 19/56]
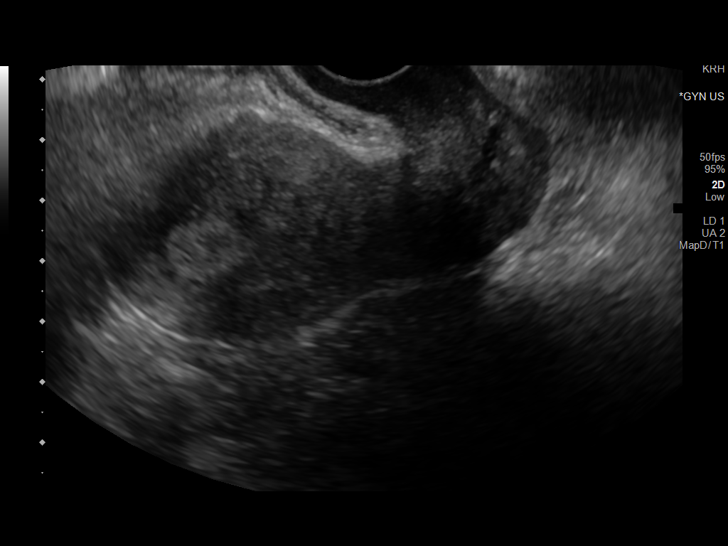
[im 21/56]
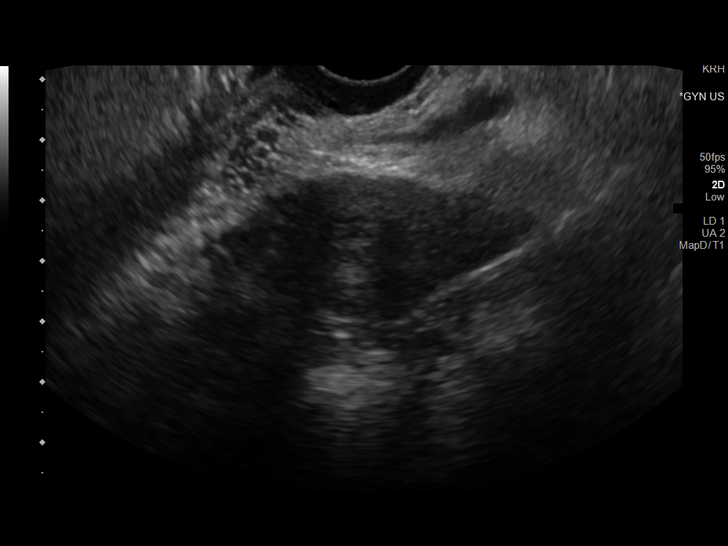
[im 26/56]
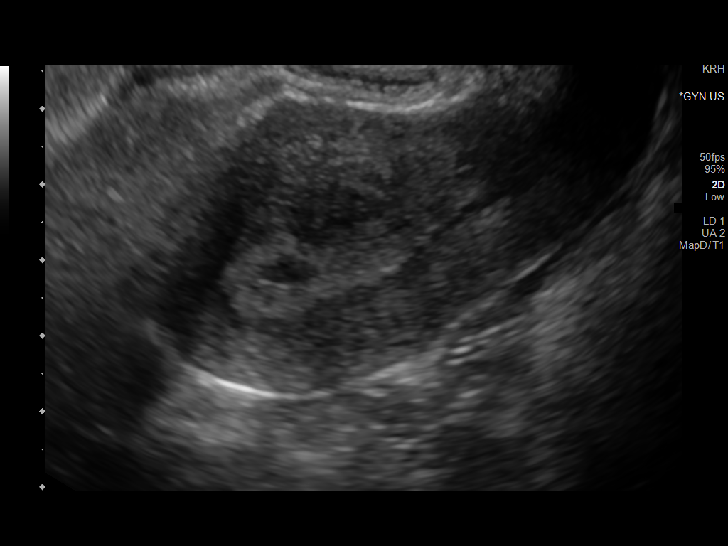
[im 30/56]
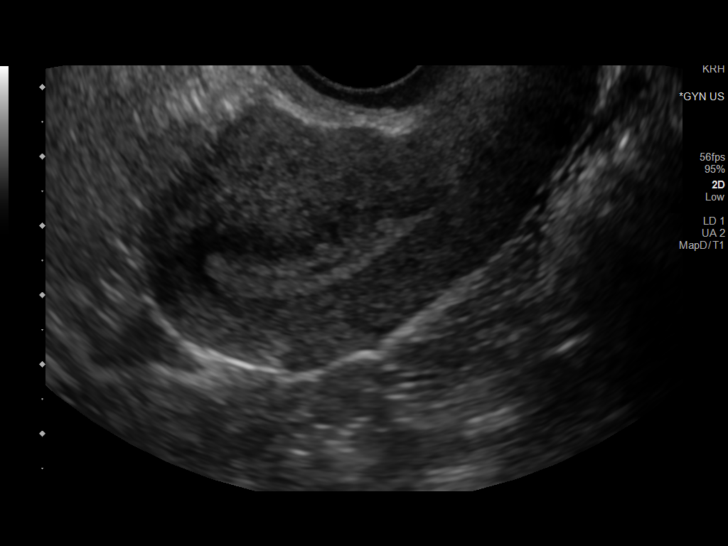
[im 35/56]
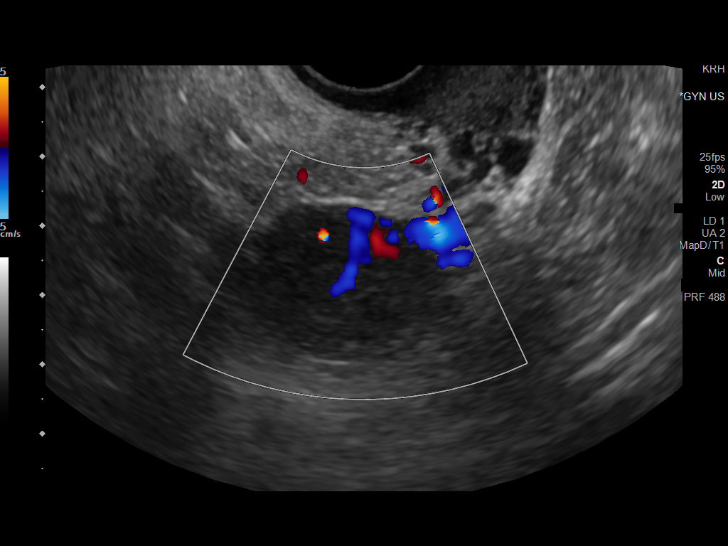
[im 37/56]
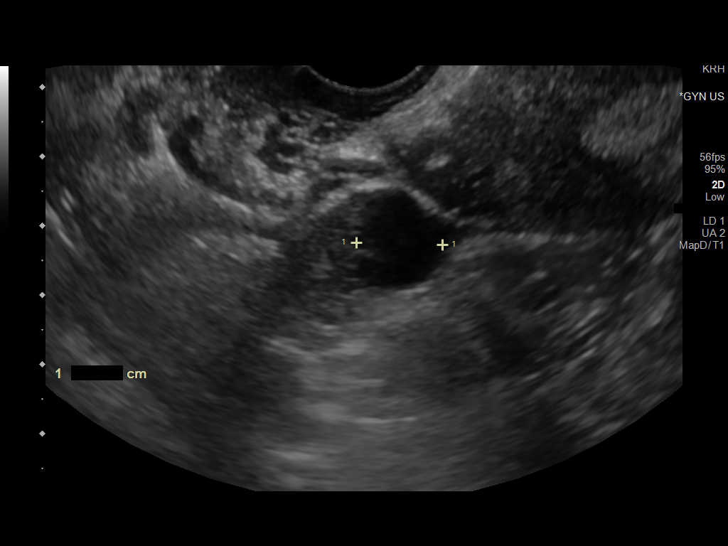
[im 42/56]
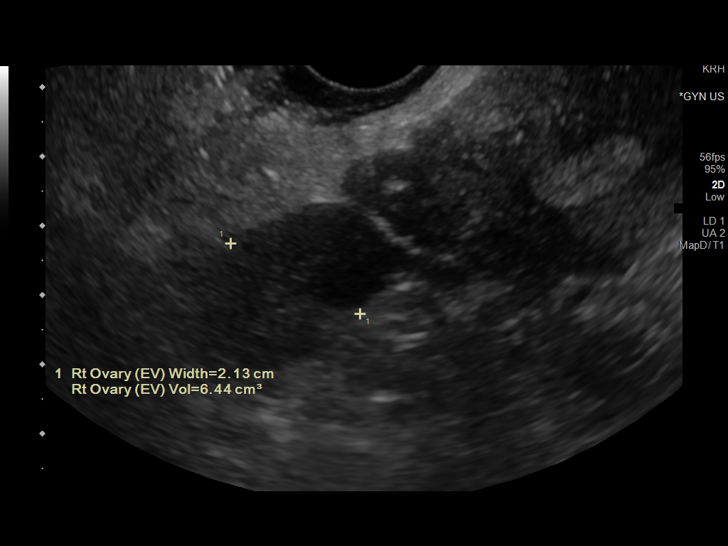
[im 46/56]
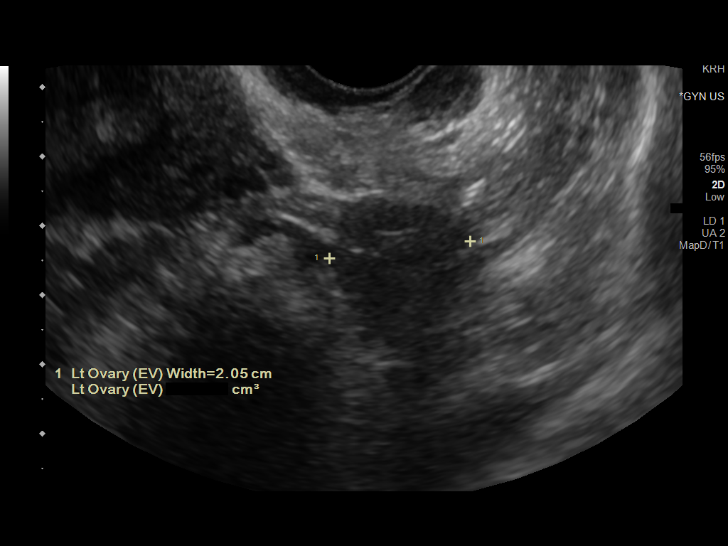
[im 51/56]
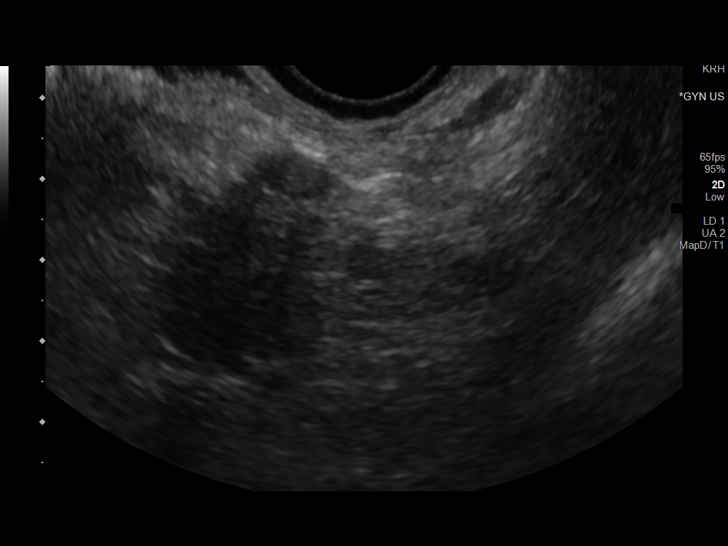
[im 56/56]
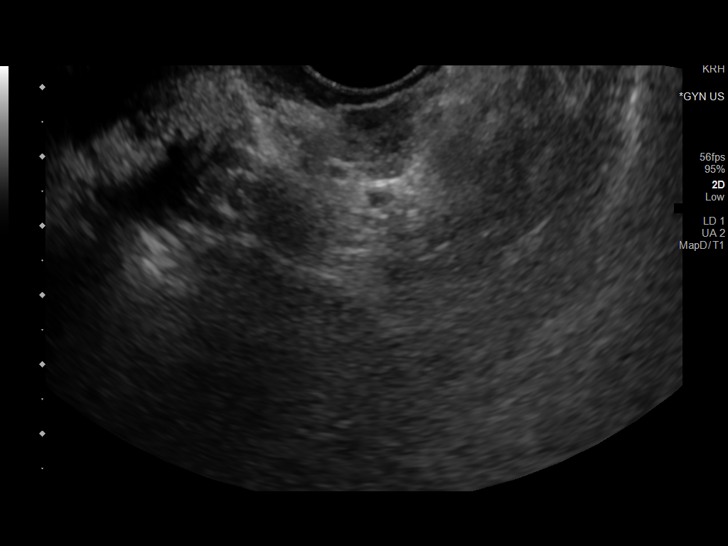

[14 of 25 positions shown; findings below may reference images not displayed]

FINDINGS: Uterus

Measurements: 7.7 x 3.5 x 4.6 cm = volume: 65.4 mL. Probable
exophytic fibroid measuring 9 mm off the anterior uterine fundus.

Endometrium

Thickness: 9 mm.  Small amount of fluid in the endometrial canal

Right ovary

Measurements: 2.7 x 2.1 x 2.1 cm = volume: 6.4 mL. Normal
appearance/no adnexal mass.

Left ovary

Measurements: 2.8 x 2.2 x 2.1 cm = volume: 6.6 mL. Normal
appearance/no adnexal mass.

Other findings

Trace fluid in the pelvis.
IMPRESSION: Fibroid uterus.

No acute process.

## 2019-11-16 NOTE — Telephone Encounter (Signed)
Received fax from [facility] and they denied coverage on Xifaxan.  I am placing in providers box for further review. - CF

## 2019-12-01 ENCOUNTER — Telehealth: Payer: BC Managed Care – PPO | Admitting: Osteopathic Medicine

## 2020-01-10 DIAGNOSIS — E1165 Type 2 diabetes mellitus with hyperglycemia: Secondary | ICD-10-CM | POA: Diagnosis not present

## 2020-01-10 DIAGNOSIS — E785 Hyperlipidemia, unspecified: Secondary | ICD-10-CM | POA: Diagnosis not present

## 2020-01-10 DIAGNOSIS — E1169 Type 2 diabetes mellitus with other specified complication: Secondary | ICD-10-CM | POA: Diagnosis not present

## 2020-02-17 ENCOUNTER — Other Ambulatory Visit: Payer: Self-pay

## 2020-02-17 DIAGNOSIS — R109 Unspecified abdominal pain: Secondary | ICD-10-CM

## 2020-02-17 MED ORDER — NAPROXEN 500 MG PO TABS: 500.0000 mg | ORAL_TABLET | Freq: Two times a day (BID) | ORAL | 1 refills | Status: AC

## 2020-04-04 IMAGING — DX DG CHEST 2V
2 series · 2 of 2 positions shown · non-contrast
Comparison: None.

CLINICAL DATA: Shortness of breath

EXAM:
CHEST - 2 VIEW

[chest pa]
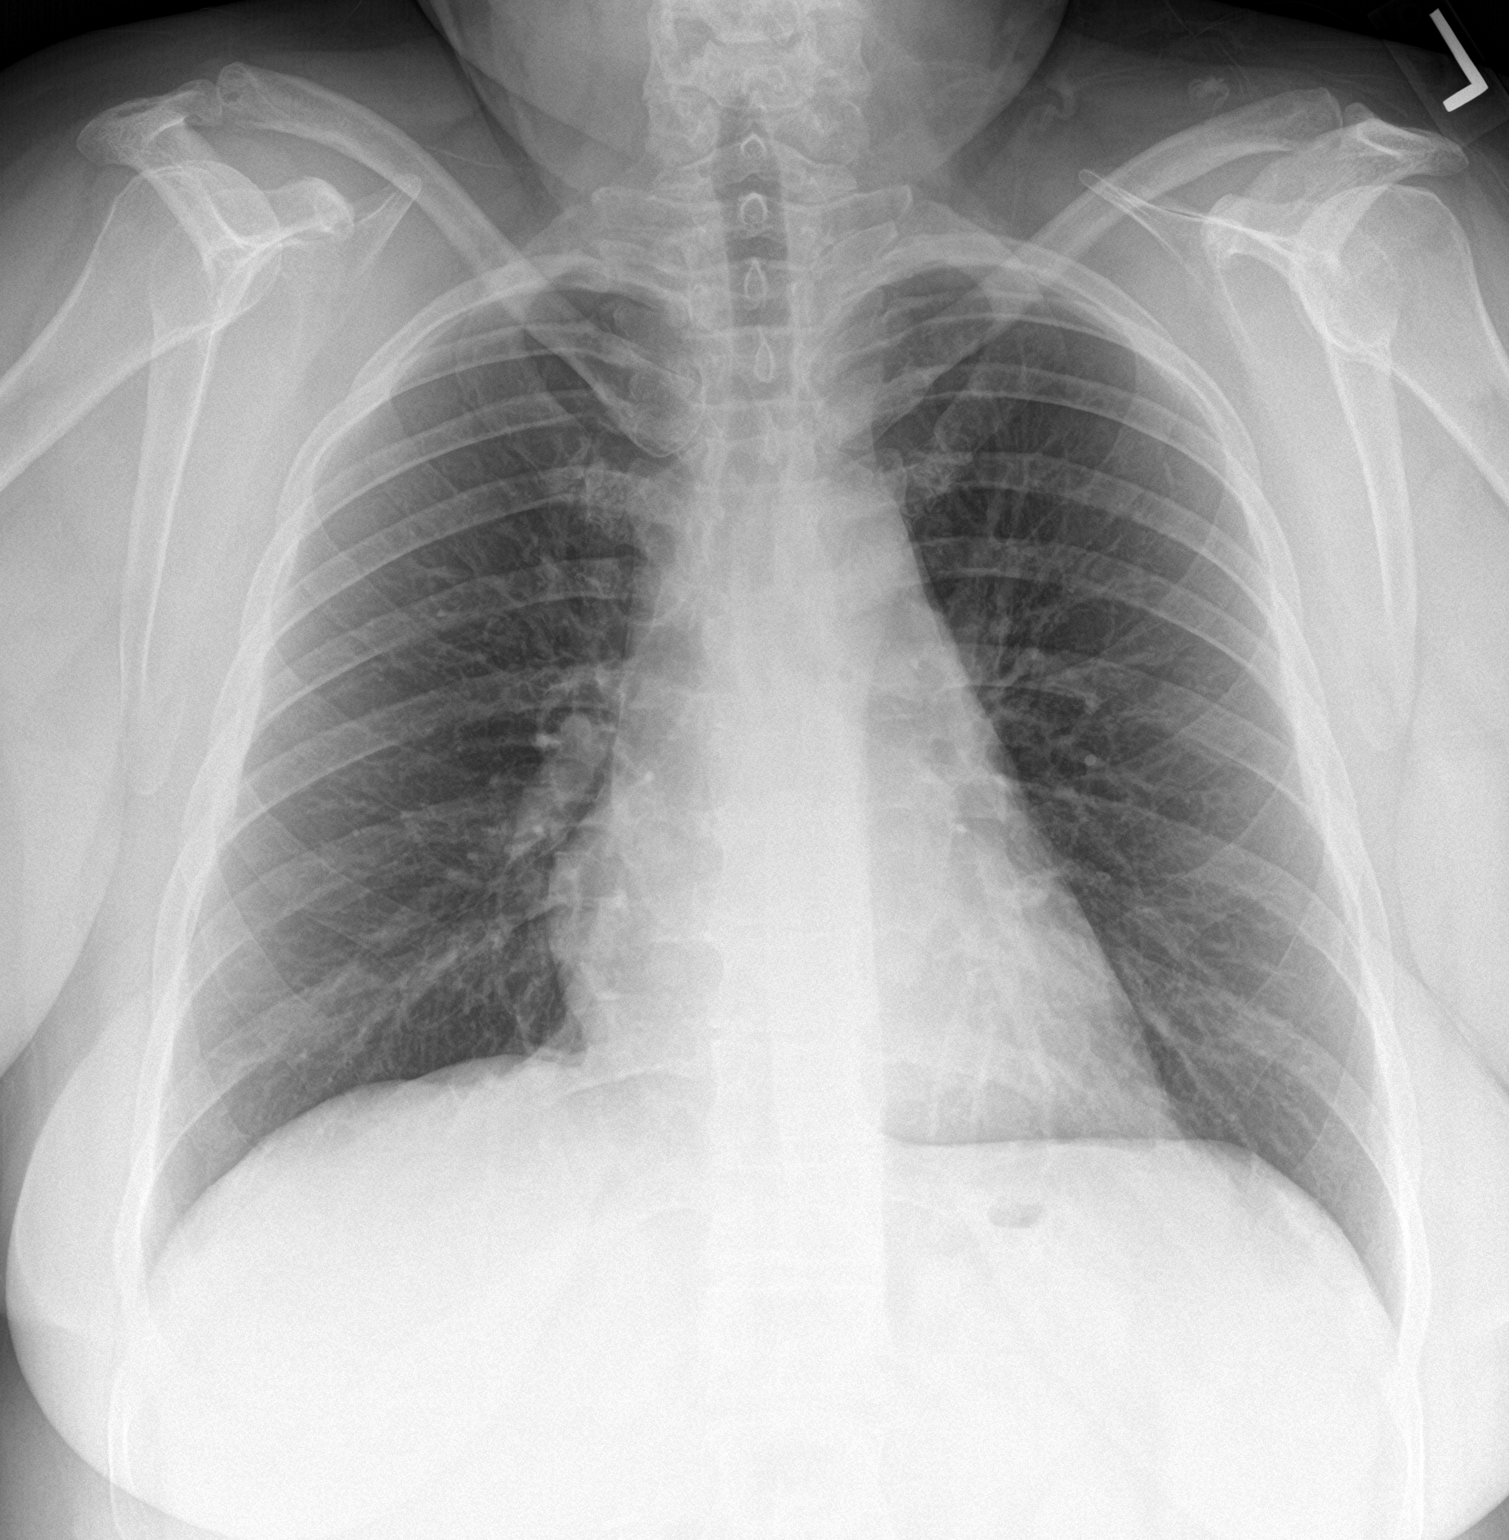

[chest lat]
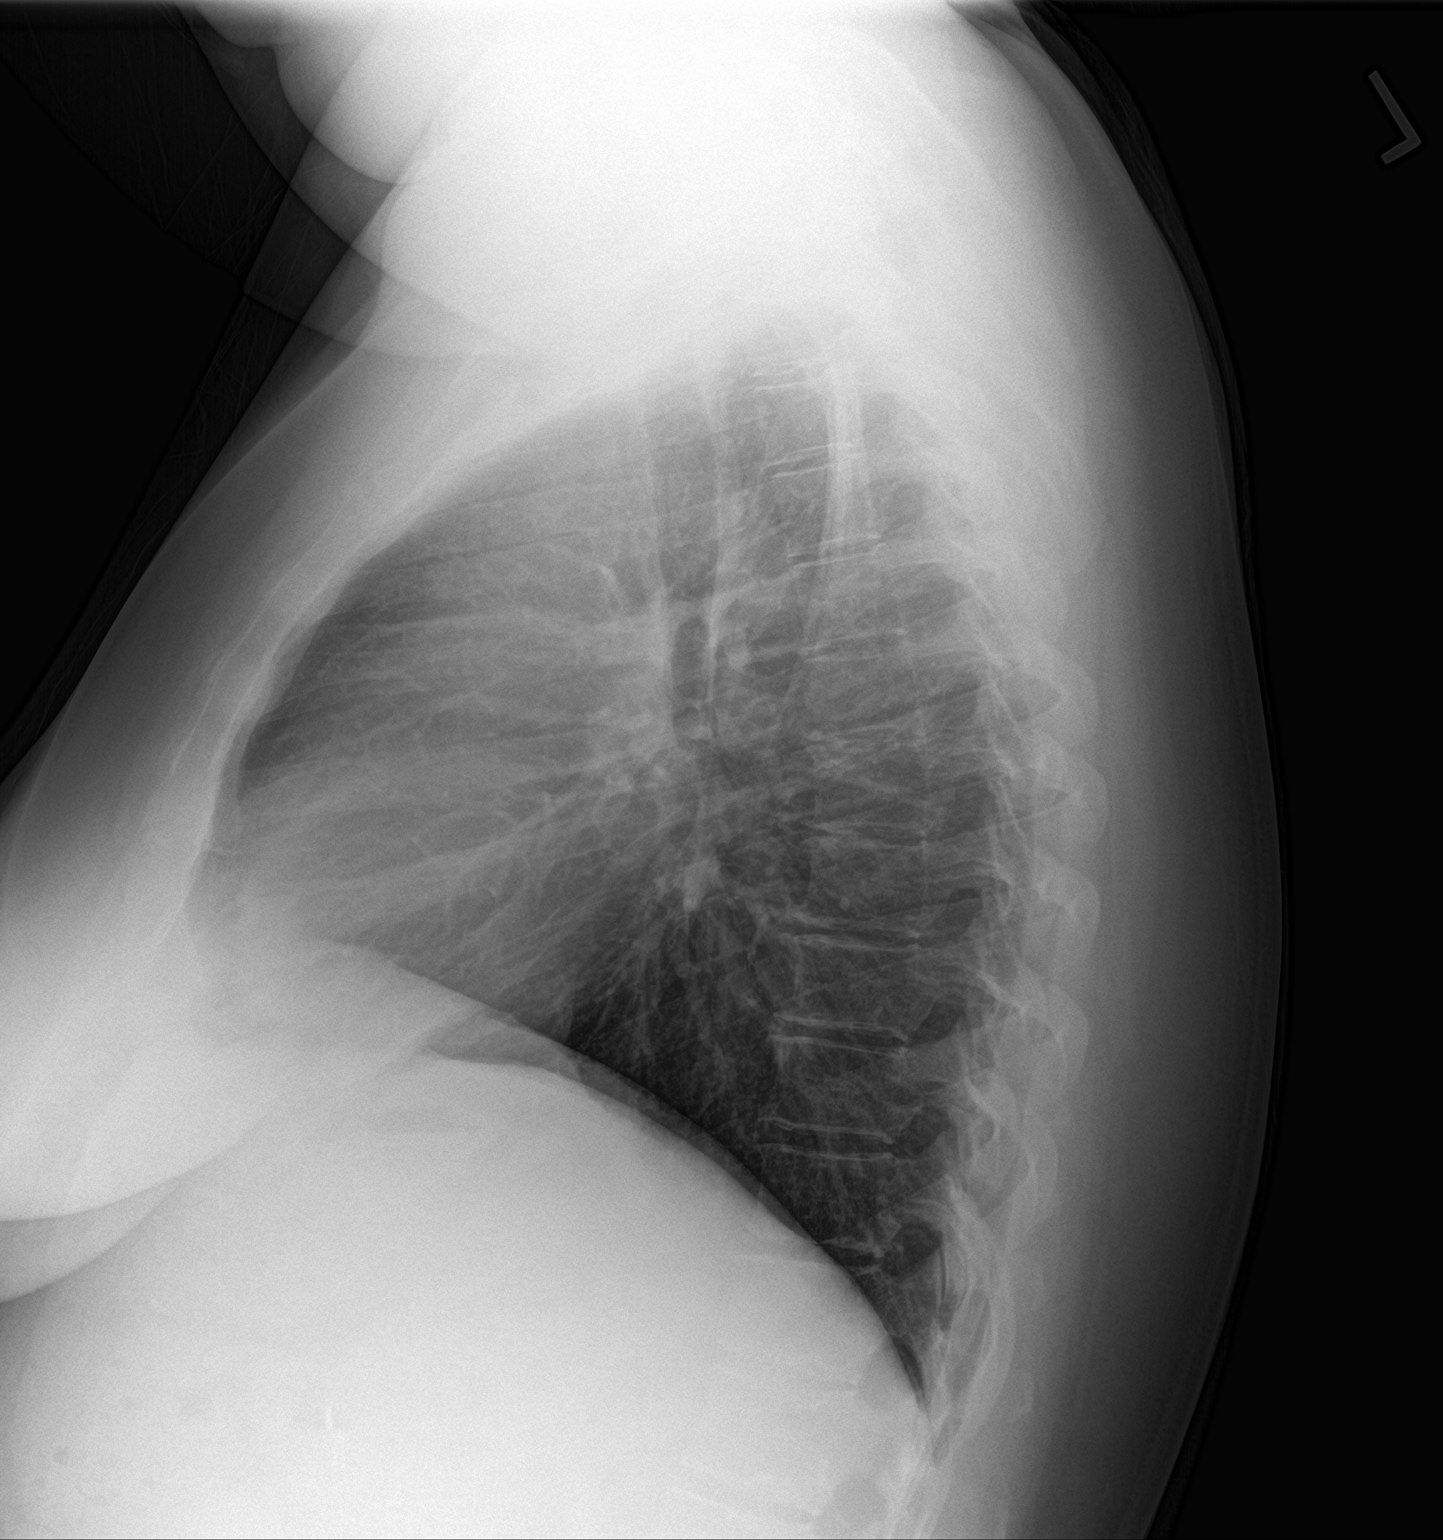

[2 of 2 positions shown; findings below may reference images not displayed]

FINDINGS: The heart size and mediastinal contours are within normal limits.
Both lungs are clear. The visualized skeletal structures are
unremarkable.
IMPRESSION: No active cardiopulmonary disease.

## 2020-10-26 ENCOUNTER — Other Ambulatory Visit: Payer: Self-pay

## 2020-10-26 ENCOUNTER — Emergency Department (HOSPITAL_BASED_OUTPATIENT_CLINIC_OR_DEPARTMENT_OTHER)
Admission: EM | Admit: 2020-10-26 | Discharge: 2020-10-26 | Disposition: A | Discharge status: Home / Self Care | Payer: BC Managed Care – PPO | Attending: Emergency Medicine | Admitting: Emergency Medicine

## 2020-10-26 ENCOUNTER — Encounter (HOSPITAL_BASED_OUTPATIENT_CLINIC_OR_DEPARTMENT_OTHER): Payer: Self-pay | Admitting: *Deleted

## 2020-10-26 DIAGNOSIS — R21 Rash and other nonspecific skin eruption: Secondary | ICD-10-CM | POA: Insufficient documentation

## 2020-10-26 DIAGNOSIS — R000 Tachycardia, unspecified: Secondary | ICD-10-CM | POA: Insufficient documentation

## 2020-10-26 DIAGNOSIS — A6009 Herpesviral infection of other urogenital tract: Secondary | ICD-10-CM

## 2020-10-26 DIAGNOSIS — A6000 Herpesviral infection of urogenital system, unspecified: Secondary | ICD-10-CM | POA: Diagnosis not present

## 2020-10-26 DIAGNOSIS — B3731 Acute candidiasis of vulva and vagina: Secondary | ICD-10-CM

## 2020-10-26 DIAGNOSIS — B373 Candidiasis of vulva and vagina: Secondary | ICD-10-CM

## 2020-10-26 DIAGNOSIS — N939 Abnormal uterine and vaginal bleeding, unspecified: Secondary | ICD-10-CM

## 2020-10-26 LAB — URINALYSIS, ROUTINE W REFLEX MICROSCOPIC
Glucose, UA: >=500 mg/dL — AB
Ketones, ur: NEGATIVE mg/dL
Nitrite: NEGATIVE
Protein, ur: 30 mg/dL — AB
Specific Gravity, Urine: 1.025 (ref 1.005–1.030)
pH: 5.5 (ref 5.0–8.0)

## 2020-10-26 LAB — WET PREP, GENITAL
Clue Cells Wet Prep HPF POC: NONE SEEN
Sperm: NONE SEEN
Trich, Wet Prep: NONE SEEN

## 2020-10-26 LAB — URINALYSIS, MICROSCOPIC (REFLEX): RBC / HPF: >50 RBC/hpf (ref 0–5)

## 2020-10-26 LAB — PREGNANCY, URINE: Preg Test, Ur: NEGATIVE

## 2020-10-26 MED ORDER — VALACYCLOVIR HCL 1 G PO TABS: 1000.0000 mg | ORAL_TABLET | Freq: Every day | ORAL | 0 refills | Status: DC

## 2020-10-26 MED ORDER — NAPROXEN 250 MG PO TABS
500.0000 mg | ORAL_TABLET | Freq: Once | ORAL | Status: AC
  Administered 2020-10-26: 500 mg via ORAL
  Filled 2020-10-26: qty 2

## 2020-10-26 MED ORDER — VALACYCLOVIR HCL 500 MG PO TABS
1000.0000 mg | ORAL_TABLET | Freq: Once | ORAL | Status: AC
  Administered 2020-10-26: 1000 mg via ORAL
  Filled 2020-10-26: qty 2

## 2020-10-26 NOTE — ED Triage Notes (Signed)
C/o vaginal bleeding after pelvic exam for STD check. PA in triage

## 2020-10-26 NOTE — ED Provider Notes (Signed)
MSE was initiated and I personally evaluated the patient and placed orders (if any) at  4:23 PM on October 26, 2020.  The patient appears stable so that the remainder of the MSE may be completed by another provider.  Patient placed in Quick Look pathway, seen and evaluated   Chief Complaint: Vaginal bleeding  HPI:   40 year old female who presents for evaluation of vaginal bleeding has been ongoing since she had a pelvic exam this afternoon at 2:30 PM.  Patient reports that she was having trouble completing the pelvic exam and states that she noticed there was blood on the table.  Patient states that since then, she has continued to bleed.  She estimates she has gone through 2 pads since then.  She is not on blood thinners.  Her last menstrual cycle was 10/12/20.  ROS: vaginal bleeding  Physical Exam:   Gen: No distress  Neuro: Awake and Alert  Skin: Warm    Focused Exam: abdomen is soft, non-distended.    Initiation of care has begun. The patient has been counseled on the process, plan, and necessity for staying for the completion/evaluation, and the remainder of the medical screening examination  Portions of this note were generated with Dragon dictation software. Dictation errors may occur despite best attempts at proofreading.     Maxwell Caul, PA-C 10/26/20 1624    Koleen Distance, MD 10/26/20 [phone]
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
## 2020-10-27 LAB — GC/CHLAMYDIA PROBE AMP (~~LOC~~) NOT AT ARMC
Chlamydia: NEGATIVE
Comment: NEGATIVE
Comment: NORMAL
Neisseria Gonorrhea: NEGATIVE

## 2020-11-02 ENCOUNTER — Telehealth: Payer: Self-pay | Admitting: Osteopathic Medicine

## 2020-11-02 ENCOUNTER — Other Ambulatory Visit: Payer: Self-pay | Admitting: Osteopathic Medicine

## 2020-11-02 MED ORDER — VALACYCLOVIR HCL 1 G PO TABS: 1000.0000 mg | ORAL_TABLET | Freq: Every day | ORAL | 0 refills | Status: DC

## 2020-11-02 MED ORDER — LIDOCAINE 5 % EX OINT: 1.0000 "application " | TOPICAL_OINTMENT | Freq: Four times a day (QID) | CUTANEOUS | 1 refills | Status: AC | PRN
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
## 2020-11-02 NOTE — Telephone Encounter (Signed)
Reviewed ER notes, patient was given 5-day course of Valtrex, if this was a primary outbreak patient can certainly could need more.  I sent a 10-day course, and lidocaine to take topically as needed for pain.  Patient has an appointment on Monday, we can talk over everything in detail and patient knows to seek urgent care in the meantime if needed

## 2020-11-06 ENCOUNTER — Telehealth (INDEPENDENT_AMBULATORY_CARE_PROVIDER_SITE_OTHER): Payer: BC Managed Care – PPO | Admitting: Osteopathic Medicine

## 2020-11-06 ENCOUNTER — Encounter: Payer: Self-pay | Admitting: Osteopathic Medicine

## 2020-11-06 VITALS — Wt 250.0 lb

## 2020-11-06 DIAGNOSIS — B009 Herpesviral infection, unspecified: Secondary | ICD-10-CM | POA: Diagnosis not present

## 2020-11-06 DIAGNOSIS — A6004 Herpesviral vulvovaginitis: Secondary | ICD-10-CM

## 2020-11-06 MED ORDER — VALACYCLOVIR HCL 500 MG PO TABS: 500.0000 mg | ORAL_TABLET | Freq: Two times a day (BID) | ORAL | 1 refills | Status: AC
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
## 2020-12-02 ENCOUNTER — Emergency Department (HOSPITAL_BASED_OUTPATIENT_CLINIC_OR_DEPARTMENT_OTHER)
Admission: EM | Admit: 2020-12-02 | Discharge: 2020-12-02 | Disposition: A | Discharge status: Home / Self Care | Payer: BC Managed Care – PPO | Attending: Emergency Medicine | Admitting: Emergency Medicine

## 2020-12-02 ENCOUNTER — Other Ambulatory Visit: Payer: Self-pay

## 2020-12-02 DIAGNOSIS — R509 Fever, unspecified: Secondary | ICD-10-CM | POA: Diagnosis not present

## 2020-12-02 DIAGNOSIS — Z86718 Personal history of other venous thrombosis and embolism: Secondary | ICD-10-CM | POA: Diagnosis not present

## 2020-12-02 DIAGNOSIS — M25562 Pain in left knee: Secondary | ICD-10-CM | POA: Insufficient documentation

## 2020-12-02 DIAGNOSIS — M25561 Pain in right knee: Secondary | ICD-10-CM | POA: Insufficient documentation
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
## 2020-12-02 NOTE — ED Triage Notes (Signed)
Bilateral joint knee pain, onset approx 1 week ago. States she has swelling in her knees. Has had a low grade fever intermittently as well.

## 2020-12-19 ENCOUNTER — Telehealth: Payer: Self-pay | Admitting: Osteopathic Medicine

## 2020-12-19 DIAGNOSIS — Z114 Encounter for screening for human immunodeficiency virus [HIV]: Secondary | ICD-10-CM

## 2020-12-19 DIAGNOSIS — T7421XD Adult sexual abuse, confirmed, subsequent encounter: Secondary | ICD-10-CM

## 2020-12-19 DIAGNOSIS — B009 Herpesviral infection, unspecified: Secondary | ICD-10-CM

## 2020-12-19 NOTE — Telephone Encounter (Signed)
Task completed. Per provider's request labs ordered. Pt has been updated regarding lab recheck. Pt was agreeable with provider's recommendation. Pt will try to complete the lab order by next Monday. [facility] lab schedule and hours provided.
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
## 2020-12-26 LAB — HEPATITIS C ANTIBODY
Hepatitis C Ab: NONREACTIVE
SIGNAL TO CUT-OFF: 0 (ref <1.00)

## 2020-12-26 LAB — RPR: RPR Ser Ql: NONREACTIVE

## 2020-12-26 LAB — HIV ANTIBODY (ROUTINE TESTING W REFLEX): HIV 1&2 Ab, 4th Generation: NONREACTIVE

## 2021-01-29 ENCOUNTER — Ambulatory Visit (INDEPENDENT_AMBULATORY_CARE_PROVIDER_SITE_OTHER): Payer: BC Managed Care – PPO | Admitting: Osteopathic Medicine

## 2021-01-29 ENCOUNTER — Encounter: Payer: Self-pay | Admitting: Osteopathic Medicine

## 2021-01-29 ENCOUNTER — Other Ambulatory Visit: Payer: Self-pay

## 2021-01-29 VITALS — BP 130/86 | HR 116 | Ht 67.0 in | Wt 248.0 lb

## 2021-01-29 DIAGNOSIS — E1165 Type 2 diabetes mellitus with hyperglycemia: Secondary | ICD-10-CM

## 2021-01-29 DIAGNOSIS — B373 Candidiasis of vulva and vagina: Secondary | ICD-10-CM

## 2021-01-29 DIAGNOSIS — R300 Dysuria: Secondary | ICD-10-CM

## 2021-01-29 DIAGNOSIS — B3731 Acute candidiasis of vulva and vagina: Secondary | ICD-10-CM

## 2021-01-29 LAB — POCT URINALYSIS DIP (CLINITEK)
Bilirubin, UA: NEGATIVE
Blood, UA: NEGATIVE
Glucose, UA: >=1000 mg/dL — AB
Leukocytes, UA: NEGATIVE
Nitrite, UA: NEGATIVE
POC PROTEIN,UA: NEGATIVE
Spec Grav, UA: >=1.03 — AB (ref 1.010–1.025)
Urobilinogen, UA: 0.2 E.U./dL
pH, UA: 6 (ref 5.0–8.0)

## 2021-01-29 LAB — POCT GLYCOSYLATED HEMOGLOBIN (HGB A1C): Hemoglobin A1C: 8.6 % — AB (ref 4.0–5.6)

## 2021-01-29 MED ORDER — GLIPIZIDE ER 5 MG PO TB24: 5.0000 mg | ORAL_TABLET | Freq: Every day | ORAL | 1 refills | Status: AC

## 2021-01-29 MED ORDER — FLUCONAZOLE 150 MG PO TABS: 150.0000 mg | ORAL_TABLET | Freq: Once | ORAL | 1 refills | Status: AC

## 2021-01-29 MED ORDER — ONDANSETRON 8 MG PO TBDP: 8.0000 mg | ORAL_TABLET | Freq: Three times a day (TID) | ORAL | 3 refills | Status: AC | PRN
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
## 2021-01-31 ENCOUNTER — Encounter: Payer: Self-pay | Admitting: Osteopathic Medicine

## 2021-02-02 MED ORDER — METFORMIN HCL 500 MG PO TABS: 500.0000 mg | ORAL_TABLET | Freq: Two times a day (BID) | ORAL | 3 refills | Status: AC

## 2021-02-07 ENCOUNTER — Telehealth: Payer: Self-pay | Admitting: Neurology

## 2021-02-07 NOTE — Telephone Encounter (Signed)
Prior Authorization for Glipizide submitted via covermymeds.   Response: "Available without authorization."

## 2021-04-30 ENCOUNTER — Ambulatory Visit: Payer: BC Managed Care – PPO | Admitting: Osteopathic Medicine
